# Patient Record
Sex: Female | Born: 1955 | Race: White | Hispanic: No | Smoking: Current every day smoker
Health system: Southern US, Community
[De-identification: ages and names within clinical notes are randomized; demographics above are authoritative.]

## PROBLEM LIST (undated history)

## (undated) DIAGNOSIS — E119 Type 2 diabetes mellitus without complications: Secondary | ICD-10-CM

## (undated) DIAGNOSIS — I10 Essential (primary) hypertension: Secondary | ICD-10-CM

## (undated) DIAGNOSIS — E785 Hyperlipidemia, unspecified: Secondary | ICD-10-CM

## (undated) HISTORY — DX: Hyperlipidemia, unspecified: E78.5

## (undated) HISTORY — PX: ANKLE FRACTURE SURGERY: SHX122

## (undated) HISTORY — DX: Essential (primary) hypertension: I10

## (undated) HISTORY — DX: Type 2 diabetes mellitus without complications: E11.9

---

## 2010-10-24 HISTORY — PX: COLONOSCOPY: SHX5424

## 2014-11-03 ENCOUNTER — Encounter: Payer: Self-pay | Admitting: Family Medicine

## 2015-01-26 ENCOUNTER — Ambulatory Visit (INDEPENDENT_AMBULATORY_CARE_PROVIDER_SITE_OTHER): Payer: BC Managed Care – PPO | Admitting: Family Medicine

## 2015-01-26 ENCOUNTER — Encounter: Payer: Self-pay | Admitting: Family Medicine

## 2015-01-26 VITALS — BP 120/60 | HR 76 | Ht 66.0 in | Wt 200.0 lb

## 2015-01-26 DIAGNOSIS — E785 Hyperlipidemia, unspecified: Secondary | ICD-10-CM | POA: Diagnosis not present

## 2015-01-26 DIAGNOSIS — E782 Mixed hyperlipidemia: Secondary | ICD-10-CM | POA: Insufficient documentation

## 2015-01-26 DIAGNOSIS — I10 Essential (primary) hypertension: Secondary | ICD-10-CM | POA: Diagnosis not present

## 2015-01-26 DIAGNOSIS — E119 Type 2 diabetes mellitus without complications: Secondary | ICD-10-CM

## 2015-01-26 DIAGNOSIS — Z23 Encounter for immunization: Secondary | ICD-10-CM

## 2015-01-26 MED ORDER — ASPIRIN 81 MG PO TABS: 81.0000 mg | ORAL_TABLET | Freq: Every day | ORAL | Status: DC

## 2015-01-26 MED ORDER — METFORMIN HCL 500 MG PO TABS: 500.0000 mg | ORAL_TABLET | Freq: Two times a day (BID) | ORAL | Status: DC

## 2015-01-26 MED ORDER — METOPROLOL TARTRATE 50 MG PO TABS: 50.0000 mg | ORAL_TABLET | Freq: Two times a day (BID) | ORAL | Status: DC

## 2015-01-26 MED ORDER — GEMFIBROZIL 600 MG PO TABS: 600.0000 mg | ORAL_TABLET | Freq: Two times a day (BID) | ORAL | Status: DC

## 2015-01-26 NOTE — Patient Instructions (Signed)
Smoking Cessation Quitting smoking is important to your health and has many advantages. However, it is not always easy to quit since nicotine is a very addictive drug. Oftentimes, people try 3 times or more before being able to quit. This document explains the best ways for you to prepare to quit smoking. Quitting takes hard work and a lot of effort, but you can do it. ADVANTAGES OF QUITTING SMOKING  You will live longer, feel better, and live better.  Your body will feel the impact of quitting smoking almost immediately.  Within 20 minutes, blood pressure decreases. Your pulse returns to its normal level.  After 8 hours, carbon monoxide levels in the blood return to normal. Your oxygen level increases.  After 24 hours, the chance of having a heart attack starts to decrease. Your breath, hair, and body stop smelling like smoke.  After 48 hours, damaged nerve endings begin to recover. Your sense of taste and smell improve.  After 72 hours, the body is virtually free of nicotine. Your bronchial tubes relax and breathing becomes easier.  After 2 to 12 weeks, lungs can hold more air. Exercise becomes easier and circulation improves.  The risk of having a heart attack, stroke, cancer, or lung disease is greatly reduced.  After 1 year, the risk of coronary heart disease is cut in half.  After 5 years, the risk of stroke falls to the same as a nonsmoker.  After 10 years, the risk of lung cancer is cut in half and the risk of other cancers decreases significantly.  After 15 years, the risk of coronary heart disease drops, usually to the level of a nonsmoker.  If you are pregnant, quitting smoking will improve your chances of having a healthy baby.  The people you live with, especially any children, will be healthier.  You will have extra money to spend on things other than cigarettes. QUESTIONS TO THINK ABOUT BEFORE ATTEMPTING TO QUIT You may want to talk about your answers with your  health care provider.  Why do you want to quit?  If you tried to quit in the past, what helped and what did not?  What will be the most difficult situations for you after you quit? How will you plan to handle them?  Who can help you through the tough times? Your family? Friends? A health care provider?  What pleasures do you get from smoking? What ways can you still get pleasure if you quit? Here are some questions to ask your health care provider:  How can you help me to be successful at quitting?  What medicine do you think would be best for me and how should I take it?  What should I do if I need more help?  What is smoking withdrawal like? How can I get information on withdrawal? GET READY  Set a quit date.  Change your environment by getting rid of all cigarettes, ashtrays, matches, and lighters in your home, car, or work. Do not let people smoke in your home.  Review your past attempts to quit. Think about what worked and what did not. GET SUPPORT AND ENCOURAGEMENT You have a better chance of being successful if you have help. You can get support in many ways.  Tell your family, friends, and coworkers that you are going to quit and need their support. Ask them not to smoke around you.  Get individual, group, or telephone counseling and support. Programs are available at local hospitals and health centers. Call   your local health department for information about programs in your area.  Spiritual beliefs and practices may help some smokers quit.  Download a "quit meter" on your computer to keep track of quit statistics, such as how long you have gone without smoking, cigarettes not smoked, and money saved.  Get a self-help book about quitting smoking and staying off tobacco. LEARN NEW SKILLS AND BEHAVIORS  Distract yourself from urges to smoke. Talk to someone, go for a walk, or occupy your time with a task.  Change your normal routine. Take a different route to work.  Drink tea instead of coffee. Eat breakfast in a different place.  Reduce your stress. Take a hot bath, exercise, or read a book.  Plan something enjoyable to do every day. Reward yourself for not smoking.  Explore interactive web-based programs that specialize in helping you quit. GET MEDICINE AND USE IT CORRECTLY Medicines can help you stop smoking and decrease the urge to smoke. Combining medicine with the above behavioral methods and support can greatly increase your chances of successfully quitting smoking.  Nicotine replacement therapy helps deliver nicotine to your body without the negative effects and risks of smoking. Nicotine replacement therapy includes nicotine gum, lozenges, inhalers, nasal sprays, and skin patches. Some may be available over-the-counter and others require a prescription.  Antidepressant medicine helps people abstain from smoking, but how this works is unknown. This medicine is available by prescription.  Nicotinic receptor partial agonist medicine simulates the effect of nicotine in your brain. This medicine is available by prescription. Ask your health care provider for advice about which medicines to use and how to use them based on your health history. Your health care provider will tell you what side effects to look out for if you choose to be on a medicine or therapy. Carefully read the information on the package. Do not use any other product containing nicotine while using a nicotine replacement product.  RELAPSE OR DIFFICULT SITUATIONS Most relapses occur within the first 3 months after quitting. Do not be discouraged if you start smoking again. Remember, most people try several times before finally quitting. You may have symptoms of withdrawal because your body is used to nicotine. You may crave cigarettes, be irritable, feel very hungry, cough often, get headaches, or have difficulty concentrating. The withdrawal symptoms are only temporary. They are strongest  when you first quit, but they will go away within 10-14 days. To reduce the chances of relapse, try to:  Avoid drinking alcohol. Drinking lowers your chances of successfully quitting.  Reduce the amount of caffeine you consume. Once you quit smoking, the amount of caffeine in your body increases and can give you symptoms, such as a rapid heartbeat, sweating, and anxiety.  Avoid smokers because they can make you want to smoke.  Do not let weight gain distract you. Many smokers will gain weight when they quit, usually less than 10 pounds. Eat a healthy diet and stay active. You can always lose the weight gained after you quit.  Find ways to improve your mood other than smoking. FOR MORE INFORMATION  www.smokefree.gov  Document Released: 04/05/2001 Document Revised: 08/26/2013 Document Reviewed: 07/21/2011 [employer] Patient Information 2015 [employer]. This information is not intended to replace advice given to you by your health care provider. Make sure you discuss any questions you have with your health care provider. Calorie Counting for Weight Loss Calories are energy you get from the things you eat and drink. Your body uses this energy   to keep you going throughout the day. The number of calories you eat affects your weight. When you eat more calories than your body needs, your body stores the extra calories as fat. When you eat fewer calories than your body needs, your body burns fat to get the energy it needs. Calorie counting means keeping track of how many calories you eat and drink each day. If you make sure to eat fewer calories than your body needs, you should lose weight. In order for calorie counting to work, you will need to eat the number of calories that are right for you in a day to lose a healthy amount of weight per week. A healthy amount of weight to lose per week is usually 1-2 lb (0.5-0.9 kg). A dietitian can determine how many calories you need in a day and give you  suggestions on how to reach your calorie goal.  WHAT IS MY MY PLAN? My goal is to have __________ calories per day.  If I have this many calories per day, I should lose around __________ pounds per week. WHAT DO I NEED TO KNOW ABOUT CALORIE COUNTING? In order to meet your daily calorie goal, you will need to:  Find out how many calories are in each food you would like to eat. Try to do this before you eat.  Decide how much of the food you can eat.  Write down what you ate and how many calories it had. Doing this is called keeping a food log. WHERE DO I FIND CALORIE INFORMATION? The number of calories in a food can be found on a Nutrition Facts label. Note that all the information on a label is based on a specific serving of the food. If a food does not have a Nutrition Facts label, try to look up the calories online or ask your dietitian for help. HOW DO I DECIDE HOW MUCH TO EAT? To decide how much of the food you can eat, you will need to consider both the number of calories in one serving and the size of one serving. This information can be found on the Nutrition Facts label. If a food does not have a Nutrition Facts label, look up the information online or ask your dietitian for help. Remember that calories are listed per serving. If you choose to have more than one serving of a food, you will have to multiply the calories per serving by the amount of servings you plan to eat. For example, the label on a package of bread might say that a serving size is 1 slice and that there are 90 calories in a serving. If you eat 1 slice, you will have eaten 90 calories. If you eat 2 slices, you will have eaten 180 calories. HOW DO I KEEP A FOOD LOG? After each meal, record the following information in your food log:  What you ate.  How much of it you ate.  How many calories it had.  Then, add up your calories. Keep your food log near you, such as in a small notebook in your pocket. Another option is  to use a mobile app or website. Some programs will calculate calories for you and show you how many calories you have left each time you add an item to the log. WHAT ARE SOME CALORIE COUNTING TIPS?  Use your calories on foods and drinks that will fill you up and not leave you hungry. Some examples of this include foods like nuts and   nut butters, vegetables, lean proteins, and high-fiber foods (more than 5 g fiber per serving).  Eat nutritious foods and avoid empty calories. Empty calories are calories you get from foods or beverages that do not have many nutrients, such as candy and soda. It is better to have a nutritious high-calorie food (such as an avocado) than a food with few nutrients (such as a bag of chips).  Know how many calories are in the foods you eat most often. This way, you do not have to look up how many calories they have each time you eat them.  Look out for foods that may seem like low-calorie foods but are really high-calorie foods, such as baked goods, soda, and fat-free candy.  Pay attention to calories in drinks. Drinks such as sodas, specialty coffee drinks, alcohol, and juices have a lot of calories yet do not fill you up. Choose low-calorie drinks like water and diet drinks.  Focus your calorie counting efforts on higher calorie items. Logging the calories in a garden salad that contains only vegetables is less important than calculating the calories in a milk shake.  Find a way of tracking calories that works for you. Get creative. Most people who are successful find ways to keep track of how much they eat in a day, even if they do not count every calorie. WHAT ARE SOME PORTION CONTROL TIPS?  Know how many calories are in a serving. This will help you know how many servings of a certain food you can have.  Use a measuring cup to measure serving sizes. This is helpful when you start out. With time, you will be able to estimate serving sizes for some foods.  Take some  time to put servings of different foods on your favorite plates, bowls, and cups so you know what a serving looks like.  Try not to eat straight from a bag or box. Doing this can lead to overeating. Put the amount you would like to eat in a cup or on a plate to make sure you are eating the right portion.  Use smaller plates, glasses, and bowls to prevent overeating. This is a quick and easy way to practice portion control. If your plate is smaller, less food can fit on it.  Try not to multitask while eating, such as watching TV or using your computer. If it is time to eat, sit down at a table and enjoy your food. Doing this will help you to start recognizing when you are full. It will also make you more aware of what and how much you are eating. HOW CAN I CALORIE COUNT WHEN EATING OUT?  Ask for smaller portion sizes or child-sized portions.  Consider sharing an entree and sides instead of getting your own entree.  If you get your own entree, eat only half. Ask for a box at the beginning of your meal and put the rest of your entree in it so you are not tempted to eat it.  Look for the calories on the menu. If calories are listed, choose the lower calorie options.  Choose dishes that include vegetables, fruits, whole grains, low-fat dairy products, and lean protein. Focusing on smart food choices from each of the 5 food groups can help you stay on track at restaurants.  Choose items that are boiled, broiled, grilled, or steamed.  Choose water, milk, unsweetened iced tea, or other drinks without added sugars. If you want an alcoholic beverage, choose a lower calorie option.   For example, a regular margarita can have up to 700 calories and a glass of wine has around 150.  Stay away from items that are buttered, battered, fried, or served with cream sauce. Items labeled "crispy" are usually fried, unless stated otherwise.  Ask for dressings, sauces, and syrups on the side. These are usually very  high in calories, so do not eat much of them.  Watch out for salads. Many people think salads are a healthy option, but this is often not the case. Many salads come with bacon, fried chicken, lots of cheese, fried chips, and dressing. All of these items have a lot of calories. If you want a salad, choose a garden salad and ask for grilled meats or steak. Ask for the dressing on the side, or ask for olive oil and vinegar or lemon to use as dressing.  Estimate how many servings of a food you are given. For example, a serving of cooked rice is  cup or about the size of half a tennis ball or one cupcake wrapper. Knowing serving sizes will help you be aware of how much food you are eating at restaurants. The list below tells you how big or small some common portion sizes are based on everyday objects.  1 oz--4 stacked dice.  3 oz--1 deck of cards.  1 tsp--1 dice.  1 Tbsp-- a Ping-Pong ball.  2 Tbsp--1 Ping-Pong ball.   cup--1 tennis ball or 1 cupcake wrapper.  1 cup--1 baseball. Document Released: 04/11/2005 Document Revised: 08/26/2013 Document Reviewed: 02/14/2013 [employer] Patient Information 2015 [employer]. This information is not intended to replace advice given to you by your health care provider. Make sure you discuss any questions you have with your health care provider.  

## 2015-01-26 NOTE — Progress Notes (Signed)
Name: Brandy Cummings   MRN: [id number]    DOB: February 18, 1956   Date:01/26/2015       Progress Note  Subjective  Chief Complaint  Chief Complaint  Patient presents with  . Hypertension  . Hyperlipidemia  . Diabetes    Hypertension This is a chronic problem. The current episode started more than 1 year ago. The problem has been gradually improving since onset. The problem is controlled. Pertinent negatives include no anxiety, blurred vision, chest pain, headaches, malaise/fatigue, neck pain, orthopnea, palpitations, peripheral edema, PND, shortness of breath or sweats. Risk factors for coronary artery disease include dyslipidemia, obesity, smoking/tobacco exposure and post-menopausal state. Past treatments include beta blockers. The current treatment provides moderate improvement. There are no compliance problems.  There is no history of angina, kidney disease, CAD/MI, CVA, heart failure, left ventricular hypertrophy, PVD, renovascular disease or retinopathy. There is no history of chronic renal disease.  Hyperlipidemia This is a chronic problem. The current episode started more than 1 year ago. The problem is controlled. Recent lipid tests were reviewed and are low. Exacerbating diseases include obesity. She has no history of chronic renal disease, diabetes, hypothyroidism, liver disease or nephrotic syndrome. There are no known factors aggravating her hyperlipidemia. Pertinent negatives include no chest pain, focal weakness, myalgias or shortness of breath. Current antihyperlipidemic treatment includes fibric acid derivatives. The current treatment provides moderate improvement of lipids. There are no compliance problems.  Risk factors for coronary artery disease include diabetes mellitus, dyslipidemia, hypertension, obesity and post-menopausal.  Diabetes She presents for her follow-up diabetic visit. Her disease course has been improving. Pertinent negatives for hypoglycemia include no confusion,  dizziness, headaches, hunger, mood changes, nervousness/anxiousness, pallor, seizures, sleepiness, speech difficulty, sweats or tremors. Associated symptoms include polydipsia and polyuria. Pertinent negatives for diabetes include no blurred vision, no chest pain, no fatigue, no foot paresthesias, no foot ulcerations, no polyphagia, no visual change, no weakness and no weight loss. There are no hypoglycemic complications. Pertinent negatives for hypoglycemia complications include no blackouts. Symptoms are stable. Pertinent negatives for diabetic complications include no CVA, PVD or retinopathy. She is following a generally healthy diet. Home blood sugar record trend: not checking. Eye exam is current.    No problem-specific assessment & plan notes found for this encounter.   Past Medical History  Diagnosis Date  . Hyperlipidemia   . Hypertension   . Diabetes mellitus without complication [facility])     Past Surgical History  Procedure Laterality Date  . Ankle fracture surgery Left   . Colonoscopy  10/24/2010    cleared for 5 years- [facility]     Family History  Problem Relation Age of Onset  . Diabetes Mother   . Cancer Father   . Heart disease Father   . Cancer Maternal Grandfather   . Cancer Paternal Grandmother     Social History   Social History  . Marital Status: Divorced    Spouse Name: N/A  . Number of Children: N/A  . Years of Education: N/A   Occupational History  . Not on file.   Social History Main Topics  . Smoking status: Current Every Day Smoker  . Smokeless tobacco: Not on file  . Alcohol Use: No  . Drug Use: No  . Sexual Activity: No   Other Topics Concern  . Not on file   Social History Narrative  . No narrative on file    No Known Allergies   Review of Systems  Constitutional: Negative for fever, chills, weight  loss, malaise/fatigue and fatigue.  HENT: Negative for ear discharge, ear pain and sore throat.   Eyes: Negative for blurred vision.   Respiratory: Negative for cough, sputum production, shortness of breath and wheezing.   Cardiovascular: Negative for chest pain, palpitations, orthopnea, leg swelling and PND.  Gastrointestinal: Negative for heartburn, nausea, abdominal pain, diarrhea, constipation, blood in stool and melena.  Genitourinary: Negative for dysuria, urgency, frequency and hematuria.  Musculoskeletal: Negative for myalgias, back pain, joint pain and neck pain.       "knot" tendon left foot  Skin: Negative for pallor and rash.  Neurological: Negative for dizziness, tingling, tremors, sensory change, focal weakness, seizures, speech difficulty, weakness and headaches.  Endo/Heme/Allergies: Positive for polydipsia. Negative for environmental allergies and polyphagia. Does not bruise/bleed easily.  Psychiatric/Behavioral: Negative for depression, suicidal ideas and confusion. The patient is not nervous/anxious and does not have insomnia.      Objective  Filed Vitals:   01/26/15 0810  BP: 120/60  Pulse: 76  Height:  (1.676 m)  Weight: 200 lb (90.719 kg)    Physical Exam  Constitutional: She is well-developed, well-nourished, and in no distress. No distress.  HENT:  Head: Normocephalic and atraumatic.  Right Ear: External ear normal.  Left Ear: External ear normal.  Nose: Nose normal.  Mouth/Throat: Oropharynx is clear and moist.  Eyes: Conjunctivae and EOM are normal. Pupils are equal, round, and reactive to light. Right eye exhibits no discharge. Left eye exhibits no discharge.  Neck: Normal range of motion. Neck supple. No JVD present. No thyromegaly present.  Cardiovascular: Normal rate, regular rhythm, normal heart sounds and intact distal pulses.  Exam reveals no gallop and no friction rub.   No murmur heard. Pulmonary/Chest: Effort normal and breath sounds normal.  Abdominal: Soft. Bowel sounds are normal. She exhibits no mass. There is no tenderness. There is no guarding.  Musculoskeletal:  Normal range of motion. She exhibits no edema.  Lymphadenopathy:    She has no cervical adenopathy.  Neurological: She is alert. She has normal reflexes.  Skin: Skin is warm and dry. She is not diaphoretic.  Psychiatric: Mood and affect normal.  Nursing note and vitals reviewed.     Assessment & Plan  Problem List Items Addressed This Visit      Cardiovascular and Mediastinum   Essential hypertension - Primary   Relevant Medications   aspirin 81 MG tablet   gemfibrozil (LOPID) 600 MG tablet   metoprolol (LOPRESSOR) 50 MG tablet   Other Relevant Orders   Renal Function Panel     Endocrine   Type 2 diabetes mellitus without complication, without long-term current use of insulin (HCC)   Relevant Medications   aspirin 81 MG tablet   metFORMIN (GLUCOPHAGE) 500 MG tablet   Other Relevant Orders   HgB A1c     Other   Hyperlipidemia   Relevant Medications   aspirin 81 MG tablet   gemfibrozil (LOPID) 600 MG tablet   metoprolol (LOPRESSOR) 50 MG tablet   Other Relevant Orders   Lipid Profile    Other Visit Diagnoses    Need for influenza vaccination        Relevant Orders    Flu Vaccine QUAD 36+ mos PF IM (Fluarix & Fluzone Quad PF) (Completed)    Need for pneumococcal vaccination        Relevant Orders    Pneumococcal polysaccharide vaccine 23-valent greater than or equal to 2yo subcutaneous/IM (Completed)  Dr. Hayden [facility]  01/26/2015

## 2015-01-27 LAB — RENAL FUNCTION PANEL
ALBUMIN: 4.6 g/dL (ref 3.5–5.5)
BUN/Creatinine Ratio: 19 (ref 9–23)
BUN: 14 mg/dL (ref 6–24)
CALCIUM: 9.7 mg/dL (ref 8.7–10.2)
CHLORIDE: 99 mmol/L (ref 97–108)
CO2: 23 mmol/L (ref 18–29)
Creatinine, Ser: 0.73 mg/dL (ref 0.57–1.00)
GFR calc Af Amer: 105 mL/min/{1.73_m2} (ref >59)
GFR calc non Af Amer: 91 mL/min/{1.73_m2} (ref >59)
Glucose: 103 mg/dL — ABNORMAL HIGH (ref 65–99)
Phosphorus: 3.9 mg/dL (ref 2.5–4.5)
Potassium: 5.1 mmol/L (ref 3.5–5.2)
Sodium: 139 mmol/L (ref 134–144)

## 2015-01-27 LAB — HEMOGLOBIN A1C: HEMOGLOBIN A1C: 6.3 % — AB (ref 4.8–5.6)

## 2015-01-27 LAB — LIPID PANEL
Chol/HDL Ratio: 5.5 ratio units — ABNORMAL HIGH (ref 0.0–4.4)
Cholesterol, Total: 236 mg/dL — ABNORMAL HIGH (ref 100–199)
HDL: 43 mg/dL (ref >39)
LDL Calculated: 159 mg/dL — ABNORMAL HIGH (ref 0–99)
Triglycerides: 171 mg/dL — ABNORMAL HIGH (ref 0–149)
VLDL CHOLESTEROL CAL: 34 mg/dL (ref 5–40)

## 2015-07-27 ENCOUNTER — Ambulatory Visit (INDEPENDENT_AMBULATORY_CARE_PROVIDER_SITE_OTHER): Payer: BC Managed Care – PPO | Admitting: Family Medicine

## 2015-07-27 ENCOUNTER — Encounter: Payer: Self-pay | Admitting: Family Medicine

## 2015-07-27 VITALS — BP 130/70 | HR 68 | Ht 66.0 in | Wt 201.0 lb

## 2015-07-27 DIAGNOSIS — E785 Hyperlipidemia, unspecified: Secondary | ICD-10-CM | POA: Diagnosis not present

## 2015-07-27 DIAGNOSIS — E119 Type 2 diabetes mellitus without complications: Secondary | ICD-10-CM | POA: Diagnosis not present

## 2015-07-27 DIAGNOSIS — I10 Essential (primary) hypertension: Secondary | ICD-10-CM

## 2015-07-27 MED ORDER — METFORMIN HCL 500 MG PO TABS: 500.0000 mg | ORAL_TABLET | Freq: Two times a day (BID) | ORAL | Status: DC

## 2015-07-27 MED ORDER — GEMFIBROZIL 600 MG PO TABS: 600.0000 mg | ORAL_TABLET | Freq: Two times a day (BID) | ORAL | Status: DC

## 2015-07-27 MED ORDER — ASPIRIN 81 MG PO TABS: 81.0000 mg | ORAL_TABLET | Freq: Every day | ORAL | Status: DC

## 2015-07-27 MED ORDER — METOPROLOL TARTRATE 50 MG PO TABS: 50.0000 mg | ORAL_TABLET | Freq: Two times a day (BID) | ORAL | Status: DC

## 2015-07-27 NOTE — Progress Notes (Signed)
Name: Brandy Cummings   MRN: [id number]    DOB: 08-Aug-1955   Date:07/27/2015       Progress Note  Subjective  Chief Complaint  Chief Complaint  Patient presents with  . Hypertension  . Hyperlipidemia  . Diabetes    Hypertension This is a chronic problem. The current episode started more than 1 year ago. The problem has been gradually improving since onset. The problem is controlled. Associated symptoms include anxiety. Pertinent negatives include no blurred vision, chest pain, headaches, malaise/fatigue, neck pain, orthopnea, palpitations, peripheral edema, PND, shortness of breath or sweats. There are no associated agents to hypertension. There are no known risk factors for coronary artery disease. Past treatments include beta blockers. The current treatment provides moderate improvement. There are no compliance problems.  There is no history of angina, kidney disease, CAD/MI, CVA, heart failure, left ventricular hypertrophy, PVD, renovascular disease or retinopathy. There is no history of chronic renal disease or a hypertension causing med.  Hyperlipidemia This is a chronic problem. The current episode started more than 1 year ago. The problem is controlled. Recent lipid tests were reviewed and are normal. Exacerbating diseases include diabetes. She has no history of chronic renal disease, hypothyroidism, liver disease, obesity or nephrotic syndrome. There are no known factors aggravating her hyperlipidemia. Pertinent negatives include no chest pain, focal sensory loss, focal weakness, leg pain, myalgias or shortness of breath. Current antihyperlipidemic treatment includes statins. The current treatment provides no improvement of lipids. There are no compliance problems.  Risk factors for coronary artery disease include hypertension, post-menopausal, dyslipidemia and diabetes mellitus.  Diabetes She presents for her follow-up diabetic visit. She has type 2 diabetes mellitus. Her disease course has  been stable. Pertinent negatives for hypoglycemia include no confusion, dizziness, headaches, hunger, mood changes, nervousness/anxiousness, pallor, seizures, sleepiness, speech difficulty, sweats or tremors. Pertinent negatives for diabetes include no blurred vision, no chest pain, no fatigue, no foot paresthesias, no foot ulcerations, no polydipsia, no polyphagia, no polyuria, no visual change, no weakness and no weight loss. There are no hypoglycemic complications. Pertinent negatives for diabetic complications include no CVA, PVD or retinopathy. Risk factors for coronary artery disease include tobacco exposure. Current diabetic treatment includes oral agent (monotherapy). She is compliant with treatment all of the time.    No problem-specific assessment & plan notes found for this encounter.   Past Medical History  Diagnosis Date  . Hyperlipidemia   . Hypertension   . Diabetes mellitus without complication [facility])     Past Surgical History  Procedure Laterality Date  . Ankle fracture surgery Left   . Colonoscopy  10/24/2010    cleared for 5 years- [facility]     Family History  Problem Relation Age of Onset  . Diabetes Mother   . Cancer Father   . Heart disease Father   . Cancer Maternal Grandfather   . Cancer Paternal Grandmother     Social History   Social History  . Marital Status: Divorced    Spouse Name: N/A  . Number of Children: N/A  . Years of Education: N/A   Occupational History  . Not on file.   Social History Main Topics  . Smoking status: Current Every Day Smoker  . Smokeless tobacco: Not on file  . Alcohol Use: No  . Drug Use: No  . Sexual Activity: No   Other Topics Concern  . Not on file   Social History Narrative    No Known Allergies   Review of Systems  Constitutional: Negative for fever, chills, weight loss, malaise/fatigue and fatigue.  HENT: Negative for ear discharge, ear pain and sore throat.   Eyes: Negative for blurred vision.   Respiratory: Negative for cough, sputum production, shortness of breath and wheezing.   Cardiovascular: Negative for chest pain, palpitations, orthopnea, leg swelling and PND.  Gastrointestinal: Negative for heartburn, nausea, abdominal pain, diarrhea, constipation, blood in stool and melena.  Genitourinary: Negative for dysuria, urgency, frequency and hematuria.  Musculoskeletal: Negative for myalgias, back pain, joint pain and neck pain.  Skin: Negative for pallor and rash.  Neurological: Negative for dizziness, tingling, tremors, sensory change, focal weakness, seizures, speech difficulty, weakness and headaches.  Endo/Heme/Allergies: Negative for environmental allergies, polydipsia and polyphagia. Does not bruise/bleed easily.  Psychiatric/Behavioral: Negative for depression, suicidal ideas and confusion. The patient is not nervous/anxious and does not have insomnia.      Objective  Filed Vitals:   07/27/15 0804  BP: 130/70  Pulse: 68  Height: 5\' 6"  (1.676 m)  Weight: 201 lb (91.173 kg)    Physical Exam  Constitutional: She is oriented to person, place, and time and well-developed, well-nourished, and in no distress. No distress.  HENT:  Head: Normocephalic and atraumatic.  Right Ear: External ear normal.  Left Ear: External ear normal.  Nose: Nose normal.  Mouth/Throat: Oropharynx is clear and moist.  Eyes: Conjunctivae and EOM are normal. Pupils are equal, round, and reactive to light. Right eye exhibits no discharge. Left eye exhibits no discharge.  Neck: Normal range of motion. Neck supple. No JVD present. No thyromegaly present.  Cardiovascular: Normal rate, regular rhythm, normal heart sounds and intact distal pulses.  Exam reveals no gallop and no friction rub.   No murmur heard. Pulmonary/Chest: Effort normal and breath sounds normal. No respiratory distress. She has no wheezes. She has no rales.  Abdominal: Soft. Bowel sounds are normal. She exhibits no mass. There  is no tenderness. There is no guarding.  Musculoskeletal: Normal range of motion. She exhibits no edema.  Lymphadenopathy:    She has no cervical adenopathy.  Neurological: She is alert and oriented to person, place, and time. She has normal motor skills, normal sensation, normal strength and normal reflexes.  Monofilament normal  Skin: Skin is warm and dry. She is not diaphoretic.  Psychiatric: Mood and affect normal.  Nursing note and vitals reviewed.     Assessment & Plan  Problem List Items Addressed This Visit      Cardiovascular and Mediastinum   Essential hypertension   Relevant Medications   aspirin 81 MG tablet   gemfibrozil (LOPID) 600 MG tablet   metoprolol (LOPRESSOR) 50 MG tablet   Other Relevant Orders   Renal Function Panel     Endocrine   Type 2 diabetes mellitus without complication, without long-term current use of insulin (HCC) - Primary   Relevant Medications   aspirin 81 MG tablet   metFORMIN (GLUCOPHAGE) 500 MG tablet   Other Relevant Orders   Hemoglobin A1c   Microalbumin / creatinine urine ratio     Other   Hyperlipidemia   Relevant Medications   aspirin 81 MG tablet   gemfibrozil (LOPID) 600 MG tablet   metoprolol (LOPRESSOR) 50 MG tablet   Other Relevant Orders   Lipid Profile        Dr. Hayden Rasmusseneanna Jones [facility]  07/27/2015

## 2015-07-28 LAB — HEMOGLOBIN A1C
Est. average glucose Bld gHb Est-mCnc: 126 mg/dL
Hgb A1c MFr Bld: 6 % — ABNORMAL HIGH (ref 4.8–5.6)

## 2015-07-28 LAB — RENAL FUNCTION PANEL
Albumin: 4.5 g/dL (ref 3.5–5.5)
BUN / CREAT RATIO: 17 (ref 9–23)
BUN: 13 mg/dL (ref 6–24)
CO2: 20 mmol/L (ref 18–29)
CREATININE: 0.76 mg/dL (ref 0.57–1.00)
Calcium: 9.5 mg/dL (ref 8.7–10.2)
Chloride: 96 mmol/L (ref 96–106)
GFR calc Af Amer: 100 mL/min/{1.73_m2} (ref >59)
GFR, EST NON AFRICAN AMERICAN: 87 mL/min/{1.73_m2} (ref >59)
Glucose: 104 mg/dL — ABNORMAL HIGH (ref 65–99)
PHOSPHORUS: 4 mg/dL (ref 2.5–4.5)
Potassium: 4.8 mmol/L (ref 3.5–5.2)
SODIUM: 137 mmol/L (ref 134–144)

## 2015-07-28 LAB — MICROALBUMIN / CREATININE URINE RATIO
Creatinine, Urine: 193.8 mg/dL
MICROALB/CREAT RATIO: 19.3 mg/g creat (ref 0.0–30.0)
MICROALBUM., U, RANDOM: 37.5 ug/mL

## 2015-07-28 LAB — LIPID PANEL
CHOL/HDL RATIO: 5.3 ratio — AB (ref 0.0–4.4)
Cholesterol, Total: 232 mg/dL — ABNORMAL HIGH (ref 100–199)
HDL: 44 mg/dL (ref >39)
LDL CALC: 154 mg/dL — AB (ref 0–99)
TRIGLYCERIDES: 171 mg/dL — AB (ref 0–149)
VLDL Cholesterol Cal: 34 mg/dL (ref 5–40)

## 2016-02-02 ENCOUNTER — Encounter: Payer: Self-pay | Admitting: Family Medicine

## 2016-02-02 ENCOUNTER — Ambulatory Visit (INDEPENDENT_AMBULATORY_CARE_PROVIDER_SITE_OTHER): Payer: BC Managed Care – PPO | Admitting: Family Medicine

## 2016-02-02 VITALS — BP 130/80 | HR 80 | Ht 66.0 in | Wt 207.0 lb

## 2016-02-02 DIAGNOSIS — I10 Essential (primary) hypertension: Secondary | ICD-10-CM

## 2016-02-02 DIAGNOSIS — E119 Type 2 diabetes mellitus without complications: Secondary | ICD-10-CM | POA: Diagnosis not present

## 2016-02-02 DIAGNOSIS — E781 Pure hyperglyceridemia: Secondary | ICD-10-CM

## 2016-02-02 DIAGNOSIS — F331 Major depressive disorder, recurrent, moderate: Secondary | ICD-10-CM

## 2016-02-02 MED ORDER — GEMFIBROZIL 600 MG PO TABS: 600.0000 mg | ORAL_TABLET | Freq: Two times a day (BID) | ORAL | 1 refills | Status: DC

## 2016-02-02 MED ORDER — METOPROLOL TARTRATE 50 MG PO TABS: 50.0000 mg | ORAL_TABLET | Freq: Two times a day (BID) | ORAL | 1 refills | Status: DC

## 2016-02-02 MED ORDER — METFORMIN HCL 500 MG PO TABS: 500.0000 mg | ORAL_TABLET | Freq: Two times a day (BID) | ORAL | 1 refills | Status: DC

## 2016-02-02 MED ORDER — BUPROPION HCL ER (XL) 150 MG PO TB24: 150.0000 mg | ORAL_TABLET | Freq: Every day | ORAL | 5 refills | Status: DC

## 2016-02-02 NOTE — Progress Notes (Signed)
Name: Brandy Cummings   MRN: [id number]    DOB: 02/28/1956   Date:02/02/2016       Progress Note  Subjective  Chief Complaint  Chief Complaint  Patient presents with  . Hypertension  . Diabetes  . Hyperlipidemia    Hypertension  This is a chronic problem. The current episode started more than 1 year ago. The problem has been gradually improving since onset. The problem is controlled. Associated symptoms include anxiety. Pertinent negatives include no blurred vision, chest pain, headaches, malaise/fatigue, neck pain, orthopnea, palpitations, peripheral edema, PND, shortness of breath or sweats. There are no associated agents to hypertension. Risk factors for coronary artery disease include diabetes mellitus, smoking/tobacco exposure and dyslipidemia. Past treatments include beta blockers. The current treatment provides mild improvement. There are no compliance problems.  There is no history of angina, kidney disease, CAD/MI, CVA, heart failure, left ventricular hypertrophy, PVD, renovascular disease or retinopathy. There is no history of chronic renal disease or a hypertension causing med.  Diabetes  She presents for her follow-up diabetic visit. She has type 2 diabetes mellitus. Her disease course has been stable. Pertinent negatives for hypoglycemia include no confusion, dizziness, headaches, hunger, mood changes, nervousness/anxiousness, pallor, seizures, sleepiness, speech difficulty, sweats or tremors. Pertinent negatives for diabetes include no blurred vision, no chest pain, no fatigue, no foot paresthesias, no foot ulcerations, no polydipsia, no polyphagia, no polyuria, no visual change, no weakness and no weight loss. There are no hypoglycemic complications. Symptoms are stable. There are no diabetic complications. Pertinent negatives for diabetic complications include no CVA, PVD or retinopathy. Risk factors for coronary artery disease include hypertension, dyslipidemia, obesity and tobacco  exposure. Current diabetic treatment includes oral agent (monotherapy). She is compliant with treatment all of the time. She is following a generally healthy diet. She participates in exercise daily. An ACE inhibitor/angiotensin II receptor blocker is not being taken. She does not see a podiatrist.Eye exam is not current.  Hyperlipidemia  This is a chronic problem. The current episode started more than 1 year ago. The problem is controlled. Recent lipid tests were reviewed and are normal. She has no history of chronic renal disease, diabetes, hypothyroidism, liver disease, obesity or nephrotic syndrome. Factors aggravating her hyperlipidemia include smoking. Pertinent negatives include no chest pain, focal sensory loss, focal weakness, leg pain, myalgias or shortness of breath. Current antihyperlipidemic treatment includes fibric acid derivatives. The current treatment provides mild improvement of lipids. There are no compliance problems.  There are no known risk factors for coronary artery disease.  Depression       The patient presents with depression.  This is a new problem.  The current episode started more than 1 month ago.   The onset quality is gradual.   The problem occurs intermittently.  Associated symptoms include decreased interest and sad.  Associated symptoms include no decreased concentration, no fatigue, no helplessness, no hopelessness, does not have insomnia, not irritable, no restlessness, no appetite change, no body aches, no myalgias, no headaches, no indigestion and no suicidal ideas.( thougts occurs )     The symptoms are aggravated by family issues.  Past treatments include nothing.  Compliance with treatment is good.  Previous treatment provided no relief relief.  Past medical history includes terminal illness, anxiety and depression.     Pertinent negatives include no fibromyalgia, no hypothyroidism and no suicide attempts.   No problem-specific Assessment & Plan notes found for this  encounter.   Past Medical History:  Diagnosis Date  .  Diabetes mellitus without complication (HCC)   . Hyperlipidemia   . Hypertension     Past Surgical History:  Procedure Laterality Date  . ANKLE FRACTURE SURGERY Left   . COLONOSCOPY  10/24/2010   cleared for 5 years- [facility]     Family History  Problem Relation Age of Onset  . Diabetes Mother   . Cancer Father   . Heart disease Father   . Cancer Maternal Grandfather   . Cancer Paternal Grandmother     Social History   Social History  . Marital status: Divorced    Spouse name: N/A  . Number of children: N/A  . Years of education: N/A   Occupational History  . Not on file.   Social History Main Topics  . Smoking status: Current Every Day Smoker  . Smokeless tobacco: Not on file  . Alcohol use No  . Drug use: No  . Sexual activity: No   Other Topics Concern  . Not on file   Social History Narrative  . No narrative on file    No Known Allergies   Review of Systems  Constitutional: Negative for appetite change, chills, fatigue, fever, malaise/fatigue and weight loss.  HENT: Negative for ear discharge, ear pain and sore throat.   Eyes: Negative for blurred vision, pain, discharge and redness.  Respiratory: Negative for cough, sputum production, shortness of breath and wheezing.   Cardiovascular: Negative for chest pain, palpitations, orthopnea, leg swelling and PND.  Gastrointestinal: Negative for abdominal pain, blood in stool, constipation, diarrhea, heartburn, melena and nausea.  Genitourinary: Negative for dysuria, frequency, hematuria and urgency.  Musculoskeletal: Negative for back pain, joint pain, myalgias and neck pain.  Skin: Negative for pallor and rash.  Neurological: Negative for dizziness, tingling, tremors, sensory change, focal weakness, seizures, speech difficulty, weakness and headaches.  Endo/Heme/Allergies: Negative for environmental allergies, polydipsia and polyphagia. Does not  bruise/bleed easily.  Psychiatric/Behavioral: Positive for depression. Negative for confusion, decreased concentration and suicidal ideas. The patient is not nervous/anxious and does not have insomnia.      Objective  Vitals:   02/02/16 1333  BP: 130/80  Pulse: 80  Weight: 207 lb (93.9 kg)  Height: 5\' 6"  (1.676 m)    Physical Exam  Constitutional: She is well-developed, well-nourished, and in no distress. She is not irritable. No distress.  HENT:  Head: Normocephalic and atraumatic.  Right Ear: External ear normal.  Left Ear: External ear normal.  Nose: Nose normal.  Mouth/Throat: Oropharynx is clear and moist.  Eyes: Conjunctivae and EOM are normal. Pupils are equal, round, and reactive to light. Right eye exhibits no discharge. Left eye exhibits no discharge.  Neck: Normal range of motion. Neck supple. No JVD present. No thyromegaly present.  Cardiovascular: Normal rate, regular rhythm, normal heart sounds and intact distal pulses.  Exam reveals no gallop and no friction rub.   No murmur heard. Pulmonary/Chest: Effort normal and breath sounds normal. She has no wheezes. She has no rales.  Abdominal: Soft. Bowel sounds are normal. She exhibits no mass. There is no tenderness. There is no guarding.  Musculoskeletal: Normal range of motion. She exhibits no edema.  Lymphadenopathy:    She has no cervical adenopathy.  Neurological: She is alert. She has normal reflexes.  Skin: Skin is warm and dry. She is not diaphoretic.  Psychiatric: Mood, affect and judgment normal.  Nursing note and vitals reviewed.     Assessment & Plan  Problem List Items Addressed This Visit  Cardiovascular and Mediastinum   Essential hypertension - Primary   Relevant Medications   metoprolol (LOPRESSOR) 50 MG tablet   gemfibrozil (LOPID) 600 MG tablet   Other Relevant Orders   Renal Function Panel     Endocrine   Type 2 diabetes mellitus without complication, without long-term current use  of insulin (HCC)   Relevant Medications   metFORMIN (GLUCOPHAGE) 500 MG tablet   Other Relevant Orders   Hemoglobin A1c     Other   Hyperlipidemia   Relevant Medications   metoprolol (LOPRESSOR) 50 MG tablet   gemfibrozil (LOPID) 600 MG tablet   Other Relevant Orders   Lipid Profile    Other Visit Diagnoses    Moderate episode of recurrent major depressive disorder (HCC)       suggest resume CBT   Relevant Medications   buPROPion (WELLBUTRIN XL) 150 MG 24 hr tablet        Dr. Hayden [facility]  02/02/16

## 2016-02-03 LAB — RENAL FUNCTION PANEL
Albumin: 4.8 g/dL (ref 3.5–5.5)
BUN/Creatinine Ratio: 19 (ref 9–23)
BUN: 15 mg/dL (ref 6–24)
CO2: 19 mmol/L (ref 18–29)
Calcium: 10.1 mg/dL (ref 8.7–10.2)
Chloride: 97 mmol/L (ref 96–106)
Creatinine, Ser: 0.81 mg/dL (ref 0.57–1.00)
GFR, EST AFRICAN AMERICAN: 92 mL/min/{1.73_m2} (ref >59)
GFR, EST NON AFRICAN AMERICAN: 80 mL/min/{1.73_m2} (ref >59)
GLUCOSE: 117 mg/dL — AB (ref 65–99)
POTASSIUM: 5 mmol/L (ref 3.5–5.2)
Phosphorus: 4.3 mg/dL (ref 2.5–4.5)
SODIUM: 138 mmol/L (ref 134–144)

## 2016-02-03 LAB — LIPID PANEL
Chol/HDL Ratio: 5.5 ratio units — ABNORMAL HIGH (ref 0.0–4.4)
Cholesterol, Total: 237 mg/dL — ABNORMAL HIGH (ref 100–199)
HDL: 43 mg/dL (ref >39)
LDL CALC: 163 mg/dL — AB (ref 0–99)
Triglycerides: 153 mg/dL — ABNORMAL HIGH (ref 0–149)
VLDL CHOLESTEROL CAL: 31 mg/dL (ref 5–40)

## 2016-02-03 LAB — HEMOGLOBIN A1C
Est. average glucose Bld gHb Est-mCnc: 126 mg/dL
Hgb A1c MFr Bld: 6 % — ABNORMAL HIGH (ref 4.8–5.6)

## 2016-08-02 ENCOUNTER — Encounter: Payer: Self-pay | Admitting: Family Medicine

## 2016-08-02 ENCOUNTER — Ambulatory Visit (INDEPENDENT_AMBULATORY_CARE_PROVIDER_SITE_OTHER): Payer: BC Managed Care – PPO | Admitting: Family Medicine

## 2016-08-02 VITALS — Resp 16 | Ht 66.0 in | Wt 219.0 lb

## 2016-08-02 DIAGNOSIS — I10 Essential (primary) hypertension: Secondary | ICD-10-CM | POA: Diagnosis not present

## 2016-08-02 DIAGNOSIS — F3341 Major depressive disorder, recurrent, in partial remission: Secondary | ICD-10-CM | POA: Diagnosis not present

## 2016-08-02 DIAGNOSIS — E781 Pure hyperglyceridemia: Secondary | ICD-10-CM | POA: Diagnosis not present

## 2016-08-02 DIAGNOSIS — F331 Major depressive disorder, recurrent, moderate: Secondary | ICD-10-CM | POA: Insufficient documentation

## 2016-08-02 DIAGNOSIS — E119 Type 2 diabetes mellitus without complications: Secondary | ICD-10-CM

## 2016-08-02 MED ORDER — METFORMIN HCL 500 MG PO TABS: 500.0000 mg | ORAL_TABLET | Freq: Two times a day (BID) | ORAL | 1 refills | Status: DC

## 2016-08-02 MED ORDER — BUPROPION HCL ER (XL) 300 MG PO TB24: 300.0000 mg | ORAL_TABLET | Freq: Every day | ORAL | 1 refills | Status: DC

## 2016-08-02 MED ORDER — METOPROLOL TARTRATE 50 MG PO TABS
50.0000 mg | ORAL_TABLET | Freq: Two times a day (BID) | ORAL | 1 refills | Status: DC
Start: 2016-08-02 — End: 2017-01-23

## 2016-08-02 MED ORDER — GEMFIBROZIL 600 MG PO TABS
600.0000 mg | ORAL_TABLET | Freq: Two times a day (BID) | ORAL | 1 refills | Status: DC
Start: 2016-08-02 — End: 2017-01-23

## 2016-08-02 NOTE — Progress Notes (Signed)
Name: Brandy Cummings   MRN: [id number]    DOB: 05/14/1955   Date:08/02/2016       Progress Note  Subjective  Chief Complaint  Chief Complaint  Patient presents with  . Hypertension  . Diabetes    Does not check BS- Last A1C 6.0 10/17    Hypertension  This is a chronic problem. The current episode started more than 1 year ago. The problem has been gradually improving since onset. The problem is controlled. Pertinent negatives include no anxiety, blurred vision, chest pain, headaches, malaise/fatigue, neck pain, orthopnea, palpitations, peripheral edema, PND, shortness of breath or sweats. There are no associated agents to hypertension. Risk factors for coronary artery disease include dyslipidemia and diabetes mellitus. Past treatments include beta blockers. The current treatment provides moderate improvement. There are no compliance problems.  There is no history of angina, kidney disease, CAD/MI, CVA, heart failure, left ventricular hypertrophy, PVD or retinopathy. There is no history of chronic renal disease, a hypertension causing med or renovascular disease.  Diabetes  She presents for her follow-up diabetic visit. She has type 2 diabetes mellitus. Her disease course has been stable. Pertinent negatives for hypoglycemia include no confusion, dizziness, headaches, hunger, mood changes, nervousness/anxiousness, pallor, seizures, sleepiness, speech difficulty, sweats or tremors. Pertinent negatives for diabetes include no blurred vision, no chest pain, no fatigue, no foot paresthesias, no foot ulcerations, no polydipsia, no polyphagia, no polyuria, no visual change, no weakness and no weight loss. There are no hypoglycemic complications. Symptoms are stable. Pertinent negatives for diabetic complications include no autonomic neuropathy, CVA, heart disease, impotence, peripheral neuropathy, PVD or retinopathy. There are no known risk factors for coronary artery disease. Current diabetic treatment  includes oral agent (monotherapy). She is following a generally healthy diet. When asked about meal planning, she reported none. She participates in exercise daily. Her breakfast blood glucose is taken between 7-8 am. Her breakfast blood glucose range is generally 110-130 mg/dl. An ACE inhibitor/angiotensin II receptor blocker is not being taken. She does not see a podiatrist.Eye exam is current.  Hyperlipidemia  This is a chronic problem. The problem is controlled. Recent lipid tests were reviewed and are normal. She has no history of chronic renal disease. Pertinent negatives include no chest pain, focal weakness, myalgias or shortness of breath. She is currently on no antihyperlipidemic treatment. The current treatment provides moderate improvement of lipids. There are no compliance problems.  Risk factors for coronary artery disease include diabetes mellitus, hypertension and dyslipidemia.  Depression       The patient presents with depression.  This is a chronic problem.  The current episode started more than 1 year ago.   The onset quality is gradual.   The problem has been waxing and waning since onset.  Associated symptoms include insomnia, irritable and sad.  Associated symptoms include no decreased concentration, no fatigue, no helplessness, no hopelessness, no restlessness, no decreased interest, no appetite change, no body aches, no myalgias, no headaches, no indigestion and no suicidal ideas.  Past treatments include SSRIs - Selective serotonin reuptake inhibitors.  Compliance with treatment is good.  Past medical history includes depression.     Pertinent negatives include no anxiety.   No problem-specific Assessment & Plan notes found for this encounter.   Past Medical History:  Diagnosis Date  . Diabetes mellitus without complication (HCC)   . Hyperlipidemia   . Hypertension     Past Surgical History:  Procedure Laterality Date  . ANKLE FRACTURE SURGERY Left   .  COLONOSCOPY   10/24/2010   cleared for 5 years- [facility]     Family History  Problem Relation Age of Onset  . Diabetes Mother   . Cancer Father   . Heart disease Father   . Cancer Maternal Grandfather   . Cancer Paternal Grandmother     Social History   Social History  . Marital status: Divorced    Spouse name: N/A  . Number of children: N/A  . Years of education: N/A   Occupational History  . Not on file.   Social History Main Topics  . Smoking status: Current Every Day Smoker    Packs/day: 1.00    Types: Cigarettes  . Smokeless tobacco: Never Used  . Alcohol use No  . Drug use: No  . Sexual activity: No   Other Topics Concern  . Not on file   Social History Narrative  . No narrative on file    No Known Allergies  Outpatient Medications Prior to Visit  Medication Sig Dispense Refill  . aspirin 81 MG tablet Take 1 tablet (81 mg total) by mouth daily. 30 tablet 11  . buPROPion (WELLBUTRIN XL) 150 MG 24 hr tablet Take 1 tablet (150 mg total) by mouth daily. 30 tablet 5  . gemfibrozil (LOPID) 600 MG tablet Take 1 tablet (600 mg total) by mouth 2 (two) times daily. 180 tablet 1  . metFORMIN (GLUCOPHAGE) 500 MG tablet Take 1 tablet (500 mg total) by mouth 2 (two) times daily. 180 tablet 1  . metoprolol (LOPRESSOR) 50 MG tablet Take 1 tablet (50 mg total) by mouth 2 (two) times daily. 180 tablet 1   No facility-administered medications prior to visit.     Review of Systems  Constitutional: Negative for appetite change, chills, fatigue, fever, malaise/fatigue and weight loss.  HENT: Negative for ear discharge, ear pain and sore throat.   Eyes: Negative for blurred vision.  Respiratory: Negative for cough, sputum production, shortness of breath and wheezing.   Cardiovascular: Negative for chest pain, palpitations, orthopnea, leg swelling and PND.  Gastrointestinal: Negative for abdominal pain, blood in stool, constipation, diarrhea, heartburn, melena and nausea.  Genitourinary:  Negative for dysuria, frequency, hematuria, impotence and urgency.  Musculoskeletal: Negative for back pain, joint pain, myalgias and neck pain.  Skin: Negative for pallor and rash.  Neurological: Negative for dizziness, tingling, tremors, sensory change, focal weakness, seizures, speech difficulty, weakness and headaches.  Endo/Heme/Allergies: Negative for environmental allergies, polydipsia and polyphagia. Does not bruise/bleed easily.  Psychiatric/Behavioral: Positive for depression. Negative for confusion, decreased concentration and suicidal ideas. The patient has insomnia. The patient is not nervous/anxious.      Objective  Vitals:   08/02/16 1554  Resp: 16  Weight: 219 lb (99.3 kg)  Height:  (1.676 m)    Physical Exam  Constitutional: She is well-developed, well-nourished, and in no distress. She is irritable. No distress.  HENT:  Head: Normocephalic and atraumatic.  Right Ear: External ear normal.  Left Ear: External ear normal.  Nose: Nose normal.  Mouth/Throat: Oropharynx is clear and moist.  Eyes: Conjunctivae and EOM are normal. Pupils are equal, round, and reactive to light. Right eye exhibits no discharge. Left eye exhibits no discharge.  Neck: Normal range of motion. Neck supple. No JVD present. No thyromegaly present.  Cardiovascular: Normal rate, regular rhythm, normal heart sounds and intact distal pulses.  Exam reveals no gallop and no friction rub.   No murmur heard. Pulmonary/Chest: Effort normal and breath sounds normal. She  has no wheezes. She has no rales.  Abdominal: Soft. Bowel sounds are normal. She exhibits no mass. There is no tenderness. There is no guarding.  Musculoskeletal: Normal range of motion. She exhibits no edema.  Lymphadenopathy:    She has no cervical adenopathy.  Neurological: She is alert. She has normal reflexes.  Skin: Skin is warm and dry. She is not diaphoretic.  Psychiatric: Mood and affect normal.  Nursing note and vitals  reviewed.     Assessment & Plan  Problem List Items Addressed This Visit      Cardiovascular and Mediastinum   Essential hypertension - Primary   Relevant Medications   metoprolol (LOPRESSOR) 50 MG tablet   gemfibrozil (LOPID) 600 MG tablet   Other Relevant Orders   Renal Function Panel     Endocrine   Type 2 diabetes mellitus without complication, without long-term current use of insulin (HCC)   Relevant Medications   metFORMIN (GLUCOPHAGE) 500 MG tablet   Other Relevant Orders   Hemoglobin A1c   Renal Function Panel     Other   Hyperlipidemia   Relevant Medications   metoprolol (LOPRESSOR) 50 MG tablet   gemfibrozil (LOPID) 600 MG tablet   Other Relevant Orders   Lipid Profile   Recurrent major depressive disorder, in partial remission (HCC)   Relevant Medications   buPROPion (WELLBUTRIN XL) 300 MG 24 hr tablet   Moderate episode of recurrent major depressive disorder (HCC)   Relevant Medications   buPROPion (WELLBUTRIN XL) 300 MG 24 hr tablet      Meds ordered this encounter  Medications  . metoprolol (LOPRESSOR) 50 MG tablet    Sig: Take 1 tablet (50 mg total) by mouth 2 (two) times daily.    Dispense:  180 tablet    Refill:  1  . gemfibrozil (LOPID) 600 MG tablet    Sig: Take 1 tablet (600 mg total) by mouth 2 (two) times daily.    Dispense:  180 tablet    Refill:  1  . metFORMIN (GLUCOPHAGE) 500 MG tablet    Sig: Take 1 tablet (500 mg total) by mouth 2 (two) times daily.    Dispense:  180 tablet    Refill:  1  . buPROPion (WELLBUTRIN XL) 300 MG 24 hr tablet    Sig: Take 1 tablet (300 mg total) by mouth daily.    Dispense:  90 tablet    Refill:  1      Dr. Elizabeth Sauer [facility]  08/02/16

## 2016-08-03 LAB — RENAL FUNCTION PANEL
ALBUMIN: 4.6 g/dL (ref 3.5–5.5)
BUN/Creatinine Ratio: 12 (ref 9–23)
BUN: 10 mg/dL (ref 6–24)
CALCIUM: 9.4 mg/dL (ref 8.7–10.2)
CHLORIDE: 105 mmol/L (ref 96–106)
CO2: 22 mmol/L (ref 18–29)
Creatinine, Ser: 0.82 mg/dL (ref 0.57–1.00)
GFR calc Af Amer: 91 mL/min/{1.73_m2} (ref >59)
GFR calc non Af Amer: 79 mL/min/{1.73_m2} (ref >59)
GLUCOSE: 104 mg/dL — AB (ref 65–99)
PHOSPHORUS: 3.8 mg/dL (ref 2.5–4.5)
Potassium: 4.9 mmol/L (ref 3.5–5.2)
SODIUM: 142 mmol/L (ref 134–144)

## 2016-08-03 LAB — HEMOGLOBIN A1C
ESTIMATED AVERAGE GLUCOSE: 126 mg/dL
Hgb A1c MFr Bld: 6 % — ABNORMAL HIGH (ref 4.8–5.6)

## 2016-08-03 LAB — LIPID PANEL
Chol/HDL Ratio: 5.5 ratio — ABNORMAL HIGH (ref 0.0–4.4)
Cholesterol, Total: 193 mg/dL (ref 100–199)
HDL: 35 mg/dL — ABNORMAL LOW (ref >39)
LDL Calculated: 103 mg/dL — ABNORMAL HIGH (ref 0–99)
Triglycerides: 273 mg/dL — ABNORMAL HIGH (ref 0–149)
VLDL Cholesterol Cal: 55 mg/dL — ABNORMAL HIGH (ref 5–40)

## 2016-09-27 ENCOUNTER — Encounter: Payer: Self-pay | Admitting: Family Medicine

## 2016-09-27 ENCOUNTER — Ambulatory Visit (INDEPENDENT_AMBULATORY_CARE_PROVIDER_SITE_OTHER): Payer: BC Managed Care – PPO | Admitting: Family Medicine

## 2016-09-27 VITALS — BP 130/70 | HR 64 | Ht 66.0 in | Wt 214.0 lb

## 2016-09-27 DIAGNOSIS — W57XXXA Bitten or stung by nonvenomous insect and other nonvenomous arthropods, initial encounter: Secondary | ICD-10-CM | POA: Diagnosis not present

## 2016-09-27 DIAGNOSIS — R21 Rash and other nonspecific skin eruption: Secondary | ICD-10-CM

## 2016-09-27 MED ORDER — DOXYCYCLINE HYCLATE 100 MG PO TABS: 100.0000 mg | ORAL_TABLET | Freq: Two times a day (BID) | ORAL | 0 refills | Status: DC

## 2016-09-27 MED ORDER — TRIAMCINOLONE ACETONIDE 0.1 % EX CREA: 1.0000 "application " | TOPICAL_CREAM | Freq: Two times a day (BID) | CUTANEOUS | 0 refills | Status: DC

## 2016-09-27 NOTE — Progress Notes (Signed)
Name: Brandy Cummings   MRN: [id number]    DOB: 01/25/1956   Date:09/27/2016       Progress Note  Subjective  Chief Complaint  Chief Complaint  Patient presents with  . Insect Bite    pulled tick off 11 days ago, prob had been there "a couple of days before finding it"- red and swollen    Patient sustained a tick bite ellevin days ago. Location back  Noted chills,diaphoresis and possibly low grade fever.    Rash  This is a new problem. The current episode started 1 to 4 weeks ago (11 days). The problem is unchanged. The affected locations include the back. The rash is characterized by redness, swelling and itchiness. She was exposed to an insect bite/sting. Pertinent negatives include no congestion, cough, diarrhea, fatigue, fever, joint pain, shortness of breath or sore throat. Past treatments include nothing. The treatment provided mild relief.    No problem-specific Assessment & Plan notes found for this encounter.   Past Medical History:  Diagnosis Date  . Diabetes mellitus without complication (HCC)   . Hyperlipidemia   . Hypertension     Past Surgical History:  Procedure Laterality Date  . ANKLE FRACTURE SURGERY Left   . COLONOSCOPY  10/24/2010   cleared for 5 years- [facility]     Family History  Problem Relation Age of Onset  . Diabetes Mother   . Cancer Father   . Heart disease Father   . Cancer Maternal Grandfather   . Cancer Paternal Grandmother     Social History   Social History  . Marital status: Divorced    Spouse name: N/A  . Number of children: N/A  . Years of education: N/A   Occupational History  . Not on file.   Social History Main Topics  . Smoking status: Current Every Day Smoker    Packs/day: 1.00    Types: Cigarettes  . Smokeless tobacco: Never Used  . Alcohol use No  . Drug use: No  . Sexual activity: No   Other Topics Concern  . Not on file   Social History Narrative  . No narrative on file    No Known Allergies  Outpatient  Medications Prior to Visit  Medication Sig Dispense Refill  . aspirin 81 MG tablet Take 1 tablet (81 mg total) by mouth daily. 30 tablet 11  . buPROPion (WELLBUTRIN XL) 300 MG 24 hr tablet Take 1 tablet (300 mg total) by mouth daily. 90 tablet 1  . gemfibrozil (LOPID) 600 MG tablet Take 1 tablet (600 mg total) by mouth 2 (two) times daily. 180 tablet 1  . metFORMIN (GLUCOPHAGE) 500 MG tablet Take 1 tablet (500 mg total) by mouth 2 (two) times daily. 180 tablet 1  . metoprolol (LOPRESSOR) 50 MG tablet Take 1 tablet (50 mg total) by mouth 2 (two) times daily. 180 tablet 1   No facility-administered medications prior to visit.     Review of Systems  Constitutional: Negative for chills, fatigue, fever, malaise/fatigue and weight loss.  HENT: Negative for congestion, ear discharge, ear pain and sore throat.   Eyes: Negative for blurred vision.  Respiratory: Negative for cough, sputum production, shortness of breath and wheezing.   Cardiovascular: Negative for chest pain, palpitations and leg swelling.  Gastrointestinal: Negative for abdominal pain, blood in stool, constipation, diarrhea, heartburn, melena and nausea.  Genitourinary: Negative for dysuria, frequency, hematuria and urgency.  Musculoskeletal: Negative for back pain, joint pain, myalgias and neck pain.  Skin: Negative  for rash.  Neurological: Negative for dizziness, tingling, sensory change, focal weakness and headaches.  Endo/Heme/Allergies: Negative for environmental allergies and polydipsia. Does not bruise/bleed easily.  Psychiatric/Behavioral: Negative for depression and suicidal ideas. The patient is not nervous/anxious and does not have insomnia.      Objective  Vitals:   09/27/16 1014  BP: 130/70  Pulse: 64  Weight: 214 lb (97.1 kg)  Height: 5\' 6"  (1.676 m)    Physical Exam  Constitutional: She is well-developed, well-nourished, and in no distress. No distress.  HENT:  Head: Normocephalic and atraumatic.  Right  Ear: External ear normal.  Left Ear: External ear normal.  Nose: Nose normal.  Mouth/Throat: Oropharynx is clear and moist.  Eyes: Conjunctivae and EOM are normal. Pupils are equal, round, and reactive to light. Right eye exhibits no discharge. Left eye exhibits no discharge.  Neck: Normal range of motion. Neck supple. No JVD present. No thyromegaly present.  Cardiovascular: Normal rate, regular rhythm, normal heart sounds and intact distal pulses.  Exam reveals no gallop and no friction rub.   No murmur heard. Pulmonary/Chest: Effort normal and breath sounds normal. She has no wheezes. She has no rales.  Abdominal: Soft. Bowel sounds are normal. She exhibits no mass. There is no tenderness. There is no guarding.  Musculoskeletal: Normal range of motion. She exhibits no edema.  Lymphadenopathy:    She has no cervical adenopathy.  Neurological: She is alert. She has normal reflexes.  Skin: Skin is warm and dry. Rash noted. Rash is maculopapular. She is not diaphoretic. There is erythema.  Psychiatric: Mood and affect normal.  Nursing note and vitals reviewed.     Assessment & Plan  Problem List Items Addressed This Visit    None    Visit Diagnoses    Tick bite, initial encounter    -  Primary   apply neosporin/triamcinolone   Relevant Medications   doxycycline (VIBRA-TABS) 100 MG tablet   triamcinolone cream (KENALOG) 0.1 %      Meds ordered this encounter  Medications  . doxycycline (VIBRA-TABS) 100 MG tablet    Sig: Take 1 tablet (100 mg total) by mouth 2 (two) times daily.    Dispense:  20 tablet    Refill:  0  . triamcinolone cream (KENALOG) 0.1 %    Sig: Apply 1 application topically 2 (two) times daily.    Dispense:  30 g    Refill:  0      Dr. [facility]  09/27/16

## 2017-01-23 ENCOUNTER — Encounter: Payer: Self-pay | Admitting: Family Medicine

## 2017-01-23 ENCOUNTER — Ambulatory Visit (INDEPENDENT_AMBULATORY_CARE_PROVIDER_SITE_OTHER): Payer: BC Managed Care – PPO | Admitting: Family Medicine

## 2017-01-23 VITALS — BP 138/78 | HR 76 | Ht 66.0 in | Wt 218.0 lb

## 2017-01-23 DIAGNOSIS — E781 Pure hyperglyceridemia: Secondary | ICD-10-CM

## 2017-01-23 DIAGNOSIS — Z23 Encounter for immunization: Secondary | ICD-10-CM

## 2017-01-23 DIAGNOSIS — I10 Essential (primary) hypertension: Secondary | ICD-10-CM

## 2017-01-23 DIAGNOSIS — R69 Illness, unspecified: Secondary | ICD-10-CM

## 2017-01-23 DIAGNOSIS — E119 Type 2 diabetes mellitus without complications: Secondary | ICD-10-CM

## 2017-01-23 DIAGNOSIS — F3341 Major depressive disorder, recurrent, in partial remission: Secondary | ICD-10-CM

## 2017-01-23 MED ORDER — BUPROPION HCL ER (XL) 300 MG PO TB24: 300.0000 mg | ORAL_TABLET | Freq: Every day | ORAL | 1 refills | Status: DC

## 2017-01-23 MED ORDER — METFORMIN HCL 500 MG PO TABS: 500.0000 mg | ORAL_TABLET | Freq: Two times a day (BID) | ORAL | 1 refills | Status: DC

## 2017-01-23 MED ORDER — METOPROLOL TARTRATE 50 MG PO TABS: 50.0000 mg | ORAL_TABLET | Freq: Two times a day (BID) | ORAL | 1 refills | Status: DC

## 2017-01-23 MED ORDER — GEMFIBROZIL 600 MG PO TABS: 600.0000 mg | ORAL_TABLET | Freq: Two times a day (BID) | ORAL | 1 refills | Status: DC

## 2017-01-23 NOTE — Progress Notes (Signed)
Name: Brandy Cummings   MRN: [id number]    DOB: 05/04/1955   Date:01/23/2017       Progress Note  Subjective  Chief Complaint  Chief Complaint  Patient presents with  . Depression  . Diabetes  . Hypertension  . Hyperlipidemia    Depression       The patient presents with depression.  This is a recurrent problem.  The current episode started more than 1 month ago.   The onset quality is sudden.   The problem occurs daily.  The problem has been gradually worsening since onset.  Associated symptoms include decreased interest and sad.  Associated symptoms include no decreased concentration, no fatigue, no helplessness, no hopelessness, does not have insomnia, not irritable, no restlessness, no appetite change, no body aches, no myalgias, no headaches, no indigestion and no suicidal ideas.  Past treatments include other medications (wellbutrin).  Compliance with treatment is good.  Previous treatment provided no relief relief.  Past medical history includes depression.   Diabetes  She presents for her follow-up diabetic visit. She has type 2 diabetes mellitus. Her disease course has been stable. Pertinent negatives for hypoglycemia include no confusion, dizziness, headaches, hunger, mood changes, nervousness/anxiousness, pallor, seizures, sleepiness, speech difficulty, sweats or tremors. Pertinent negatives for diabetes include no blurred vision, no chest pain, no fatigue, no foot paresthesias, no foot ulcerations, no polydipsia, no polyphagia, no polyuria, no visual change, no weakness and no weight loss. There are no hypoglycemic complications. Symptoms are stable. There are no diabetic complications. Pertinent negatives for diabetic complications include no CVA, PVD or retinopathy. There are no known risk factors for coronary artery disease. Current diabetic treatment includes oral agent (monotherapy). She is following a generally unhealthy diet. She participates in exercise daily. There is no change in  her home blood glucose trend. An ACE inhibitor/angiotensin II receptor blocker is not being taken. She does not see a podiatrist.Eye exam is not current.  Hypertension  Pertinent negatives include no blurred vision, chest pain, headaches, malaise/fatigue, neck pain, palpitations, shortness of breath or sweats. There are no associated agents to hypertension. Risk factors for coronary artery disease include post-menopausal state. Past treatments include beta blockers. The current treatment provides moderate improvement. There are no compliance problems.  There is no history of angina, kidney disease, CAD/MI, CVA, heart failure, left ventricular hypertrophy, PVD or retinopathy. There is no history of chronic renal disease, a hypertension causing med or renovascular disease.  Hyperlipidemia  This is a new problem. The current episode started more than 1 month ago. The problem is controlled. Recent lipid tests were reviewed and are normal. Exacerbating diseases include diabetes and obesity. She has no history of chronic renal disease. Pertinent negatives include no chest pain, focal sensory loss, focal weakness, leg pain, myalgias or shortness of breath. Current antihyperlipidemic treatment includes fibric acid derivatives.    No problem-specific Assessment & Plan notes found for this encounter.   Past Medical History:  Diagnosis Date  . Diabetes mellitus without complication (HCC)   . Hyperlipidemia   . Hypertension     Past Surgical History:  Procedure Laterality Date  . ANKLE FRACTURE SURGERY Left   . COLONOSCOPY  10/24/2010   cleared for 5 years- [facility]     Family History  Problem Relation Age of Onset  . Diabetes Mother   . Cancer Father   . Heart disease Father   . Cancer Maternal Grandfather   . Cancer Paternal Grandmother     Social History  Social History  . Marital status: Divorced    Spouse name: N/A  . Number of children: N/A  . Years of education: N/A    Occupational History  . Not on file.   Social History Main Topics  . Smoking status: Current Every Day Smoker    Packs/day: 1.00    Types: Cigarettes  . Smokeless tobacco: Never Used  . Alcohol use No  . Drug use: No  . Sexual activity: No   Other Topics Concern  . Not on file   Social History Narrative  . No narrative on file    No Known Allergies  Outpatient Medications Prior to Visit  Medication Sig Dispense Refill  . aspirin 81 MG tablet Take 1 tablet (81 mg total) by mouth daily. 30 tablet 11  . triamcinolone cream (KENALOG) 0.1 % Apply 1 application topically 2 (two) times daily. 30 g 0  . buPROPion (WELLBUTRIN XL) 300 MG 24 hr tablet Take 1 tablet (300 mg total) by mouth daily. 90 tablet 1  . gemfibrozil (LOPID) 600 MG tablet Take 1 tablet (600 mg total) by mouth 2 (two) times daily. 180 tablet 1  . metFORMIN (GLUCOPHAGE) 500 MG tablet Take 1 tablet (500 mg total) by mouth 2 (two) times daily. 180 tablet 1  . metoprolol (LOPRESSOR) 50 MG tablet Take 1 tablet (50 mg total) by mouth 2 (two) times daily. 180 tablet 1  . doxycycline (VIBRA-TABS) 100 MG tablet Take 1 tablet (100 mg total) by mouth 2 (two) times daily. 20 tablet 0   No facility-administered medications prior to visit.     Review of Systems  Constitutional: Negative for appetite change, chills, fatigue, fever, malaise/fatigue and weight loss.  HENT: Negative for ear discharge, ear pain and sore throat.   Eyes: Negative for blurred vision.  Respiratory: Negative for cough, sputum production, shortness of breath and wheezing.   Cardiovascular: Negative for chest pain, palpitations and leg swelling.  Gastrointestinal: Negative for abdominal pain, blood in stool, constipation, diarrhea, heartburn, melena and nausea.  Genitourinary: Negative for dysuria, frequency, hematuria and urgency.  Musculoskeletal: Negative for back pain, joint pain, myalgias and neck pain.  Skin: Negative for pallor and rash.   Neurological: Negative for dizziness, tingling, tremors, sensory change, focal weakness, seizures, speech difficulty, weakness and headaches.  Endo/Heme/Allergies: Negative for environmental allergies, polydipsia and polyphagia. Does not bruise/bleed easily.  Psychiatric/Behavioral: Positive for depression. Negative for confusion, decreased concentration and suicidal ideas. The patient is not nervous/anxious and does not have insomnia.      Objective  Vitals:   01/23/17 1009  BP: 138/78  Pulse: 76  Weight: 218 lb (98.9 kg)  Height:  (1.676 m)    Physical Exam  Constitutional: She is well-developed, well-nourished, and in no distress. She is not irritable. No distress.  HENT:  Head: Normocephalic and atraumatic.  Right Ear: External ear normal.  Left Ear: External ear normal.  Nose: Nose normal.  Mouth/Throat: Oropharynx is clear and moist.  Eyes: Pupils are equal, round, and reactive to light. Conjunctivae and EOM are normal. Right eye exhibits no discharge. Left eye exhibits no discharge.  Neck: Normal range of motion. Neck supple. No JVD present. No thyromegaly present.  Cardiovascular: Normal rate, regular rhythm, normal heart sounds and intact distal pulses.  Exam reveals no gallop and no friction rub.   No murmur heard. Pulmonary/Chest: Effort normal and breath sounds normal. She has no wheezes. She has no rales.  Abdominal: Soft. Bowel sounds are normal. She  exhibits no mass. There is no tenderness. There is no guarding.  Musculoskeletal: Normal range of motion. She exhibits no edema.  Lymphadenopathy:    She has no cervical adenopathy.  Neurological: She is alert. She has normal reflexes.  Skin: Skin is warm and dry. She is not diaphoretic.  Psychiatric: Mood and affect normal.  Nursing note and vitals reviewed.     Assessment & Plan  Problem List Items Addressed This Visit      Cardiovascular and Mediastinum   Essential hypertension   Relevant Medications    metoprolol tartrate (LOPRESSOR) 50 MG tablet   gemfibrozil (LOPID) 600 MG tablet   Other Relevant Orders   Renal Function Panel     Endocrine   Type 2 diabetes mellitus without complication, without long-term current use of insulin (HCC)   Relevant Medications   metFORMIN (GLUCOPHAGE) 500 MG tablet   Other Relevant Orders   Hemoglobin A1c     Other   Hyperlipidemia   Relevant Medications   metoprolol tartrate (LOPRESSOR) 50 MG tablet   gemfibrozil (LOPID) 600 MG tablet   Other Relevant Orders   Lipid Profile   Recurrent major depressive disorder, in partial remission (HCC)   Relevant Medications   buPROPion (WELLBUTRIN XL) 300 MG 24 hr tablet    Other Visit Diagnoses    Influenza vaccine needed    -  Primary   Relevant Orders   Flu Vaccine QUAD 36+ mos IM (Completed)   Taking medication for chronic disease       Relevant Orders   Hepatic function panel      Meds ordered this encounter  Medications  . metoprolol tartrate (LOPRESSOR) 50 MG tablet    Sig: Take 1 tablet (50 mg total) by mouth 2 (two) times daily.    Dispense:  180 tablet    Refill:  1  . gemfibrozil (LOPID) 600 MG tablet    Sig: Take 1 tablet (600 mg total) by mouth 2 (two) times daily.    Dispense:  180 tablet    Refill:  1  . buPROPion (WELLBUTRIN XL) 300 MG 24 hr tablet    Sig: Take 1 tablet (300 mg total) by mouth daily.    Dispense:  90 tablet    Refill:  1  . metFORMIN (GLUCOPHAGE) 500 MG tablet    Sig: Take 1 tablet (500 mg total) by mouth 2 (two) times daily.    Dispense:  180 tablet    Refill:  1      Dr. [facility]  01/23/17

## 2017-01-24 LAB — HEPATIC FUNCTION PANEL
ALT: 26 IU/L (ref 0–32)
AST: 30 IU/L (ref 0–40)
Alkaline Phosphatase: 85 IU/L (ref 39–117)
BILIRUBIN TOTAL: 0.2 mg/dL (ref 0.0–1.2)
Bilirubin, Direct: 0.08 mg/dL (ref 0.00–0.40)
Total Protein: 6.9 g/dL (ref 6.0–8.5)

## 2017-01-24 LAB — RENAL FUNCTION PANEL
ALBUMIN: 4.3 g/dL (ref 3.6–4.8)
BUN/Creatinine Ratio: 23 (ref 12–28)
BUN: 16 mg/dL (ref 8–27)
CALCIUM: 9.4 mg/dL (ref 8.7–10.3)
CHLORIDE: 104 mmol/L (ref 96–106)
CO2: 20 mmol/L (ref 20–29)
Creatinine, Ser: 0.69 mg/dL (ref 0.57–1.00)
GFR calc Af Amer: 109 mL/min/{1.73_m2} (ref >59)
GFR calc non Af Amer: 95 mL/min/{1.73_m2} (ref >59)
Glucose: 119 mg/dL — ABNORMAL HIGH (ref 65–99)
PHOSPHORUS: 4 mg/dL (ref 2.5–4.5)
Potassium: 4.9 mmol/L (ref 3.5–5.2)
Sodium: 142 mmol/L (ref 134–144)

## 2017-01-24 LAB — HEMOGLOBIN A1C
ESTIMATED AVERAGE GLUCOSE: 123 mg/dL
HEMOGLOBIN A1C: 5.9 % — AB (ref 4.8–5.6)

## 2017-01-24 LAB — LIPID PANEL
CHOLESTEROL TOTAL: 190 mg/dL (ref 100–199)
Chol/HDL Ratio: 4.8 ratio — ABNORMAL HIGH (ref 0.0–4.4)
HDL: 40 mg/dL (ref >39)
LDL Calculated: 109 mg/dL — ABNORMAL HIGH (ref 0–99)
TRIGLYCERIDES: 207 mg/dL — AB (ref 0–149)
VLDL CHOLESTEROL CAL: 41 mg/dL — AB (ref 5–40)

## 2017-08-01 ENCOUNTER — Ambulatory Visit: Payer: BC Managed Care – PPO | Admitting: Family Medicine

## 2017-08-02 ENCOUNTER — Encounter: Payer: Self-pay | Admitting: Family Medicine

## 2017-08-02 ENCOUNTER — Ambulatory Visit: Payer: BC Managed Care – PPO | Admitting: Family Medicine

## 2017-08-02 VITALS — BP 140/86 | HR 64 | Ht 66.0 in | Wt 220.0 lb

## 2017-08-02 DIAGNOSIS — F3341 Major depressive disorder, recurrent, in partial remission: Secondary | ICD-10-CM | POA: Diagnosis not present

## 2017-08-02 DIAGNOSIS — F17210 Nicotine dependence, cigarettes, uncomplicated: Secondary | ICD-10-CM | POA: Diagnosis not present

## 2017-08-02 DIAGNOSIS — E119 Type 2 diabetes mellitus without complications: Secondary | ICD-10-CM

## 2017-08-02 DIAGNOSIS — I10 Essential (primary) hypertension: Secondary | ICD-10-CM

## 2017-08-02 MED ORDER — METOPROLOL TARTRATE 50 MG PO TABS: 50.0000 mg | ORAL_TABLET | Freq: Two times a day (BID) | ORAL | 1 refills | Status: DC

## 2017-08-02 MED ORDER — METFORMIN HCL 500 MG PO TABS: 500.0000 mg | ORAL_TABLET | Freq: Two times a day (BID) | ORAL | 1 refills | Status: DC

## 2017-08-02 MED ORDER — VARENICLINE TARTRATE 0.5 MG PO TABS: 0.5000 mg | ORAL_TABLET | Freq: Two times a day (BID) | ORAL | 1 refills | Status: DC

## 2017-08-02 MED ORDER — BUPROPION HCL ER (XL) 300 MG PO TB24: 300.0000 mg | ORAL_TABLET | Freq: Every day | ORAL | 1 refills | Status: DC

## 2017-08-02 NOTE — Progress Notes (Signed)
Name: Brandy Cummings   MRN: [id number]    DOB: 12-Aug-1955   Date:08/02/2017       Progress Note  Subjective  Chief Complaint  Chief Complaint  Patient presents with  . Diabetes  . Hyperlipidemia  . Depression  . Hypertension    Diabetes  She presents for her follow-up diabetic visit. She has type 2 diabetes mellitus. Her disease course has been stable. There are no hypoglycemic associated symptoms. Pertinent negatives for hypoglycemia include no dizziness, headaches or nervousness/anxiousness. Pertinent negatives for diabetes include no blurred vision, no chest pain, no fatigue, no foot paresthesias, no foot ulcerations, no polydipsia, no polyphagia, no polyuria, no visual change, no weakness and no weight loss. There are no hypoglycemic complications. Symptoms are stable. There are no diabetic complications. There are no known risk factors for coronary artery disease. Current diabetic treatment includes oral agent (monotherapy). She is compliant with treatment all of the time. Her weight is stable. She is following a generally healthy diet. Meal planning includes avoidance of concentrated sweets. She participates in exercise intermittently. Her breakfast blood glucose is taken between 7-8 am. An ACE inhibitor/angiotensin II receptor blocker is being taken. Eye exam is not current.  Hyperlipidemia  This is a chronic problem. The current episode started more than 1 year ago. The problem is controlled. Recent lipid tests were reviewed and are normal. She has no history of chronic renal disease, diabetes, hypothyroidism, liver disease, obesity or nephrotic syndrome. Pertinent negatives include no chest pain, focal sensory loss, focal weakness, leg pain, myalgias or shortness of breath. Current antihyperlipidemic treatment includes statins. The current treatment provides moderate improvement of lipids. There are no compliance problems.  Risk factors for coronary artery disease include dyslipidemia,  hypertension and diabetes mellitus.  Depression         This is a chronic problem.  The current episode started more than 1 month ago.   The problem has been waxing and waning since onset.  Associated symptoms include no decreased concentration, no fatigue, no helplessness, no hopelessness, does not have insomnia, not irritable, no restlessness, no decreased interest, no appetite change, no body aches, no myalgias, no headaches, no indigestion, not sad and no suicidal ideas.  Treatments tried: welbutrin.  Compliance with treatment is good.  Past compliance problems include difficulty with treatment plan.  Previous treatment provided moderate relief.   Pertinent negatives include no hypothyroidism. Hypertension  Pertinent negatives include no blurred vision, chest pain, headaches, malaise/fatigue, neck pain, palpitations or shortness of breath. There is no history of chronic renal disease.    No problem-specific Assessment & Plan notes found for this encounter.   Past Medical History:  Diagnosis Date  . Diabetes mellitus without complication (HCC)   . Hyperlipidemia   . Hypertension     Past Surgical History:  Procedure Laterality Date  . ANKLE FRACTURE SURGERY Left   . COLONOSCOPY  10/24/2010   cleared for 5 years- [facility]     Family History  Problem Relation Age of Onset  . Diabetes Mother   . Cancer Father   . Heart disease Father   . Cancer Maternal Grandfather   . Cancer Paternal Grandmother     Social History   Socioeconomic History  . Marital status: Divorced    Spouse name: Not on file  . Number of children: Not on file  . Years of education: Not on file  . Highest education level: Not on file  Occupational History  . Not on file  Social  Needs  . Financial resource strain: Not on file  . Food insecurity:    Worry: Not on file    Inability: Not on file  . Transportation needs:    Medical: Not on file    Non-medical: Not on file  Tobacco Use  . Smoking  status: Current Every Day Smoker    Packs/day: 1.00    Types: Cigarettes  . Smokeless tobacco: Never Used  Substance and Sexual Activity  . Alcohol use: No    Alcohol/week: 0.0 oz  . Drug use: No  . Sexual activity: Never  Lifestyle  . Physical activity:    Days per week: Not on file    Minutes per session: Not on file  . Stress: Not on file  Relationships  . Social connections:    Talks on phone: Not on file    Gets together: Not on file    Attends religious service: Not on file    Active member of club or organization: Not on file    Attends meetings of clubs or organizations: Not on file    Relationship status: Not on file  . Intimate partner violence:    Fear of current or ex partner: Not on file    Emotionally abused: Not on file    Physically abused: Not on file    Forced sexual activity: Not on file  Other Topics Concern  . Not on file  Social History Narrative  . Not on file    No Known Allergies  Outpatient Medications Prior to Visit  Medication Sig Dispense Refill  . aspirin 81 MG tablet Take 1 tablet (81 mg total) by mouth daily. 30 tablet 11  . buPROPion (WELLBUTRIN XL) 300 MG 24 hr tablet Take 1 tablet (300 mg total) by mouth daily. 90 tablet 1  . gemfibrozil (LOPID) 600 MG tablet Take 1 tablet (600 mg total) by mouth 2 (two) times daily. 180 tablet 1  . metFORMIN (GLUCOPHAGE) 500 MG tablet Take 1 tablet (500 mg total) by mouth 2 (two) times daily. 180 tablet 1  . metoprolol tartrate (LOPRESSOR) 50 MG tablet Take 1 tablet (50 mg total) by mouth 2 (two) times daily. 180 tablet 1  . triamcinolone cream (KENALOG) 0.1 % Apply 1 application topically 2 (two) times daily. 30 g 0   No facility-administered medications prior to visit.     Review of Systems  Constitutional: Negative for appetite change, chills, fatigue, fever, malaise/fatigue and weight loss.  HENT: Negative for ear discharge, ear pain and sore throat.   Eyes: Negative for blurred vision.   Respiratory: Negative for cough, sputum production, shortness of breath and wheezing.   Cardiovascular: Negative for chest pain, palpitations and leg swelling.  Gastrointestinal: Negative for abdominal pain, blood in stool, constipation, diarrhea, heartburn, melena and nausea.  Genitourinary: Negative for dysuria, frequency, hematuria and urgency.  Musculoskeletal: Negative for back pain, joint pain, myalgias and neck pain.  Skin: Negative for rash.  Neurological: Negative for dizziness, tingling, sensory change, focal weakness, weakness and headaches.  Endo/Heme/Allergies: Negative for environmental allergies, polydipsia and polyphagia. Does not bruise/bleed easily.  Psychiatric/Behavioral: Positive for depression. Negative for decreased concentration and suicidal ideas. The patient is not nervous/anxious and does not have insomnia.      Objective  Vitals:   08/02/17 1342  BP: 140/86  Pulse: 64  Weight: 220 lb (99.8 kg)  Height: 5\' 6"  (1.676 m)    Physical Exam  Constitutional: She is not irritable. No distress.  HENT:  Head: Normocephalic and atraumatic.  Right Ear: External ear normal.  Left Ear: External ear normal.  Nose: Nose normal.  Mouth/Throat: Oropharynx is clear and moist.  Eyes: Pupils are equal, round, and reactive to light. Conjunctivae and EOM are normal. Right eye exhibits no discharge. Left eye exhibits no discharge.  Neck: Normal range of motion. Neck supple. No JVD present. No thyromegaly present.  Cardiovascular: Normal rate, regular rhythm, normal heart sounds and intact distal pulses. Exam reveals no gallop and no friction rub.  No murmur heard. Pulmonary/Chest: Effort normal and breath sounds normal. She has no wheezes. She has no rales.  Abdominal: Soft. Bowel sounds are normal. She exhibits no mass. There is no tenderness. There is no guarding.  Musculoskeletal: Normal range of motion. She exhibits no edema.  Lymphadenopathy:    She has no cervical  adenopathy.  Neurological: She is alert. She has normal reflexes.  Skin: Skin is warm and dry. She is not diaphoretic.  Nursing note and vitals reviewed.     Assessment & Plan  Problem List Items Addressed This Visit      Cardiovascular and Mediastinum   Essential hypertension   Relevant Medications   metoprolol tartrate (LOPRESSOR) 50 MG tablet     Endocrine   Type 2 diabetes mellitus without complication, without long-term current use of insulin (HCC)   Relevant Medications   metFORMIN (GLUCOPHAGE) 500 MG tablet     Other   Recurrent major depressive disorder, in partial remission (HCC)   Relevant Medications   buPROPion (WELLBUTRIN XL) 300 MG 24 hr tablet    Other Visit Diagnoses    Cigarette nicotine dependence without complication    -  Primary   Relevant Medications   varenicline (CHANTIX) 0.5 MG tablet      Meds ordered this encounter  Medications  . metoprolol tartrate (LOPRESSOR) 50 MG tablet    Sig: Take 1 tablet (50 mg total) by mouth 2 (two) times daily.    Dispense:  180 tablet    Refill:  1  . buPROPion (WELLBUTRIN XL) 300 MG 24 hr tablet    Sig: Take 1 tablet (300 mg total) by mouth daily.    Dispense:  90 tablet    Refill:  1  . metFORMIN (GLUCOPHAGE) 500 MG tablet    Sig: Take 1 tablet (500 mg total) by mouth 2 (two) times daily.    Dispense:  180 tablet    Refill:  1  . varenicline (CHANTIX) 0.5 MG tablet    Sig: Take 1 tablet (0.5 mg total) by mouth 2 (two) times daily.    Dispense:  60 tablet    Refill:  1  The 10-year ASCVD risk score Denman George DC Jr., et al., 2013) is: 23.5%   Values used to calculate the score:     Age: 24 years     Sex: Female     Is Non-Hispanic African American: No     Diabetic: Yes     Tobacco smoker: Yes     Systolic Blood Pressure: 140 mmHg     Is BP treated: Yes     HDL Cholesterol: 40 mg/dL     Total Cholesterol: 190 mg/dL    Dr. [facility]  08/02/17

## 2017-08-02 NOTE — Patient Instructions (Signed)
Varenicline oral tablets What is this medicine? VARENICLINE (var EN i kleen) is used to help people quit smoking. It can reduce the symptoms caused by stopping smoking. It is used with a patient support program recommended by your physician. This medicine may be used for other purposes; ask your health care provider or pharmacist if you have questions. COMMON BRAND NAME(S): Chantix What should I tell my health care provider before I take this medicine? They need to know if you have any of these conditions: -bipolar disorder, depression, schizophrenia or other mental illness -heart disease -if you often drink alcohol -kidney disease -peripheral vascular disease -seizures -stroke -suicidal thoughts, plans, or attempt; a previous suicide attempt by you or a family member -an unusual or allergic reaction to varenicline, other medicines, foods, dyes, or preservatives -pregnant or trying to get pregnant -breast-feeding How should I use this medicine? Take this medicine by mouth after eating. Take with a full glass of water. Follow the directions on the prescription label. Take your doses at regular intervals. Do not take your medicine more often than directed. There are 3 ways you can use this medicine to help you quit smoking; talk to your health care professional to decide which plan is right for you: 1) you can choose a quit date and start this medicine 1 week before the quit date, or, 2) you can start taking this medicine before you choose a quit date, and then pick a quit date between day 8 and 35 days of treatment, or, 3) if you are not sure that you are able or willing to quit smoking right away, start taking this medicine and slowly decrease the amount you smoke as directed by your health care professional with the goal of being cigarette-free by week 12 of treatment. Stick to your plan; ask about support groups or other ways to help you remain cigarette-free. If you are motivated to quit  smoking and did not succeed during a previous attempt with this medicine for reasons other than side effects, or if you returned to smoking after this treatment, speak with your health care professional about whether another course of this medicine may be right for you. A special MedGuide will be given to you by the pharmacist with each prescription and refill. Be sure to read this information carefully each time. Talk to your pediatrician regarding the use of this medicine in children. This medicine is not approved for use in children. Overdosage: If you think you have taken too much of this medicine contact a poison control center or emergency room at once. NOTE: This medicine is only for you. Do not share this medicine with others. What if I miss a dose? If you miss a dose, take it as soon as you can. If it is almost time for your next dose, take only that dose. Do not take double or extra doses. What may interact with this medicine? -alcohol or any product that contains alcohol -insulin -other stop smoking aids -theophylline -warfarin This list may not describe all possible interactions. Give your health care provider a list of all the medicines, herbs, non-prescription drugs, or dietary supplements you use. Also tell them if you smoke, drink alcohol, or use illegal drugs. Some items may interact with your medicine. What should I watch for while using this medicine? Visit your doctor or health care professional for regular check ups. Ask for ongoing advice and encouragement from your doctor or healthcare professional, friends, and family to help you quit. If   you smoke while on this medication, quit again Your mouth may get dry. Chewing sugarless gum or sucking hard candy, and drinking plenty of water may help. Contact your doctor if the problem does not go away or is severe. You may get drowsy or dizzy. Do not drive, use machinery, or do anything that needs mental alertness until you know how  this medicine affects you. Do not stand or sit up quickly, especially if you are an older patient. This reduces the risk of dizzy or fainting spells. Sleepwalking can happen during treatment with this medicine, and can sometimes lead to behavior that is harmful to you, other people, or property. Stop taking this medicine and tell your doctor if you start sleepwalking or have other unusual sleep-related activity. Decrease the amount of alcoholic beverages that you drink during treatment with this medicine until you know if this medicine affects your ability to tolerate alcohol. Some people have experienced increased drunkenness (intoxication), unusual or sometimes aggressive behavior, or no memory of things that have happened (amnesia) during treatment with this medicine. The use of this medicine may increase the chance of suicidal thoughts or actions. Pay special attention to how you are responding while on this medicine. Any worsening of mood, or thoughts of suicide or dying should be reported to your health care professional right away. What side effects may I notice from receiving this medicine? Side effects that you should report to your doctor or health care professional as soon as possible: -allergic reactions like skin rash, itching or hives, swelling of the face, lips, tongue, or throat -acting aggressive, being angry or violent, or acting on dangerous impulses -breathing problems -changes in vision -chest pain or chest tightness -confusion, trouble speaking or understanding -new or worsening depression, anxiety, or panic attacks -extreme increase in activity and talking (mania) -fast, irregular heartbeat -feeling faint or lightheaded, falls -fever -pain in legs when walking -problems with balance, talking, walking -redness, blistering, peeling or loosening of the skin, including inside the mouth -ringing in ears -seeing or hearing things that aren't there  (hallucinations) -seizures -sleepwalking -sudden numbness or weakness of the face, arm or leg -thoughts about suicide or dying, or attempts to commit suicide -trouble passing urine or change in the amount of urine -unusual bleeding or bruising -unusually weak or tired Side effects that usually do not require medical attention (report to your doctor or health care professional if they continue or are bothersome): -constipation -headache -nausea, vomiting -strange dreams -stomach gas -trouble sleeping This list may not describe all possible side effects. Call your doctor for medical advice about side effects. You may report side effects to FDA at 1-800-FDA-1088. Where should I keep my medicine? Keep out of the reach of children. Store at room temperature between 15 and 30 degrees C (59 and 86 degrees F). Throw away any unused medicine after the expiration date. NOTE: This sheet is a summary. It may not cover all possible information. If you have questions about this medicine, talk to your doctor, pharmacist, or health care provider.  2018 Elsevier/Gold Standard (2014-12-25 16:14:23)  

## 2018-02-15 ENCOUNTER — Ambulatory Visit: Payer: BC Managed Care – PPO | Admitting: Family Medicine

## 2018-02-15 ENCOUNTER — Encounter: Payer: Self-pay | Admitting: Family Medicine

## 2018-02-15 VITALS — BP 148/80 | HR 82 | Ht 66.0 in | Wt 225.0 lb

## 2018-02-15 DIAGNOSIS — E782 Mixed hyperlipidemia: Secondary | ICD-10-CM

## 2018-02-15 DIAGNOSIS — Z23 Encounter for immunization: Secondary | ICD-10-CM

## 2018-02-15 DIAGNOSIS — F17200 Nicotine dependence, unspecified, uncomplicated: Secondary | ICD-10-CM | POA: Diagnosis not present

## 2018-02-15 DIAGNOSIS — I10 Essential (primary) hypertension: Secondary | ICD-10-CM

## 2018-02-15 DIAGNOSIS — E119 Type 2 diabetes mellitus without complications: Secondary | ICD-10-CM

## 2018-02-15 MED ORDER — METOPROLOL TARTRATE 50 MG PO TABS: 50.0000 mg | ORAL_TABLET | Freq: Two times a day (BID) | ORAL | 1 refills | Status: DC

## 2018-02-15 MED ORDER — METFORMIN HCL 500 MG PO TABS: 500.0000 mg | ORAL_TABLET | Freq: Two times a day (BID) | ORAL | 1 refills | Status: DC

## 2018-02-15 NOTE — Progress Notes (Signed)
Date:  02/15/2018   Name:  Brandy Cummings   DOB:  May 30, 1955   MRN:  [id number]   Chief Complaint: Diabetes; Hypertension; Depression (PHQ=8); and Flu Vaccine Diabetes  She presents for her follow-up diabetic visit. She has type 2 diabetes mellitus. Her disease course has been stable. Pertinent negatives for hypoglycemia include no confusion, dizziness, headaches, hunger, mood changes, nervousness/anxiousness, pallor, seizures, sleepiness, speech difficulty or sweats. Pertinent negatives for diabetes include no blurred vision, no chest pain, no fatigue, no foot paresthesias, no foot ulcerations, no polydipsia, no polyphagia, no polyuria, no visual change, no weakness and no weight loss. There are no hypoglycemic complications. Symptoms are stable. There are no diabetic complications. Pertinent negatives for diabetic complications include no CVA, PVD or retinopathy. Risk factors for coronary artery disease include diabetes mellitus and hypertension. Current diabetic treatment includes oral agent (monotherapy). She is following a generally unhealthy diet. When asked about meal planning, she reported none.  Hypertension  This is a chronic problem. The problem is unchanged. The problem is controlled. Pertinent negatives include no anxiety, blurred vision, chest pain, headaches, malaise/fatigue, neck pain, orthopnea, palpitations, peripheral edema, PND, shortness of breath or sweats. There are no associated agents to hypertension. Risk factors for coronary artery disease include diabetes mellitus, smoking/tobacco exposure and obesity. Past treatments include beta blockers. The current treatment provides mild improvement. There are no compliance problems.  There is no history of angina, kidney disease, CAD/MI, CVA, heart failure, left ventricular hypertrophy, PVD or retinopathy. There is no history of chronic renal disease, a hypertension causing med or renovascular disease.  Depression         This is a  chronic problem.  The current episode started more than 1 year ago. The problem is unchanged.  Associated symptoms include helplessness, hopelessness, decreased interest and sad.  Associated symptoms include no fatigue, does not have insomnia, no myalgias and no headaches.  Treatments tried: stopped welbutrin.  Compliance with treatment is good.   Pertinent negatives include no anxiety.    Review of Systems  Constitutional: Negative.  Negative for chills, fatigue, fever, malaise/fatigue, unexpected weight change and weight loss.  HENT: Negative for congestion, ear discharge, ear pain, rhinorrhea, sinus pressure, sneezing and sore throat.   Eyes: Negative for blurred vision, photophobia, pain, discharge, redness and itching.  Respiratory: Negative for cough, shortness of breath, wheezing and stridor.   Cardiovascular: Negative for chest pain, palpitations, orthopnea and PND.  Gastrointestinal: Negative for abdominal pain, blood in stool, constipation, diarrhea, nausea and vomiting.  Endocrine: Negative for cold intolerance, heat intolerance, polydipsia, polyphagia and polyuria.  Genitourinary: Negative for dysuria, flank pain, frequency, hematuria, menstrual problem, pelvic pain, urgency, vaginal bleeding and vaginal discharge.  Musculoskeletal: Negative for arthralgias, back pain, myalgias and neck pain.  Skin: Negative for pallor and rash.  Allergic/Immunologic: Negative for environmental allergies and food allergies.  Neurological: Negative for dizziness, seizures, speech difficulty, weakness, light-headedness, numbness and headaches.  Hematological: Negative for adenopathy. Does not bruise/bleed easily.  Psychiatric/Behavioral: Positive for depression. Negative for confusion and dysphoric mood. The patient is not nervous/anxious and does not have insomnia.     Patient Active Problem List   Diagnosis Date Noted  . Recurrent major depressive disorder, in partial remission (HCC) 08/02/2016  .  Moderate episode of recurrent major depressive disorder (HCC) 08/02/2016  . Essential hypertension 01/26/2015  . Hyperlipidemia 01/26/2015  . Type 2 diabetes mellitus without complication, without long-term current use of insulin (HCC) 01/26/2015    No Known  Allergies  Past Surgical History:  Procedure Laterality Date  . ANKLE FRACTURE SURGERY Left   . COLONOSCOPY  10/24/2010   cleared for 5 years- [place]     Social History   Tobacco Use  . Smoking status: Current Every Day Smoker    Packs/day: 1.00    Types: Cigarettes  . Smokeless tobacco: Never Used  Substance Use Topics  . Alcohol use: No    Alcohol/week: 0.0 standard drinks  . Drug use: No     Medication list has been reviewed and updated.  Current Meds  Medication Sig  . aspirin 81 MG tablet Take 1 tablet (81 mg total) by mouth daily.  . metFORMIN (GLUCOPHAGE) 500 MG tablet Take 1 tablet (500 mg total) by mouth 2 (two) times daily.  . metoprolol tartrate (LOPRESSOR) 50 MG tablet Take 1 tablet (50 mg total) by mouth 2 (two) times daily.  . [DISCONTINUED] metFORMIN (GLUCOPHAGE) 500 MG tablet Take 1 tablet (500 mg total) by mouth 2 (two) times daily.  . [DISCONTINUED] metoprolol tartrate (LOPRESSOR) 50 MG tablet Take 1 tablet (50 mg total) by mouth 2 (two) times daily.    PHQ 2/9 Scores 02/15/2018 07/27/2015 01/26/2015  PHQ - 2 Score 2 0 1  PHQ- 9 Score 8 - -    Physical Exam  Constitutional: She is oriented to person, place, and time. She appears well-developed and well-nourished.  HENT:  Head: Normocephalic.  Right Ear: External ear normal.  Left Ear: External ear normal.  Mouth/Throat: Oropharynx is clear and moist.  Eyes: Pupils are equal, round, and reactive to light. Conjunctivae and EOM are normal. Lids are everted and swept, no foreign bodies found. Left eye exhibits no hordeolum. No foreign body present in the left eye. Right conjunctiva is not injected. Left conjunctiva is not injected. No scleral  icterus.  Neck: Normal range of motion. Neck supple. No JVD present. No tracheal deviation present. No thyromegaly present.  Cardiovascular: Normal rate, regular rhythm, normal heart sounds and intact distal pulses. Exam reveals no gallop and no friction rub.  No murmur heard. Pulmonary/Chest: Effort normal and breath sounds normal. No respiratory distress. She has no wheezes. She has no rales.  Abdominal: Soft. Bowel sounds are normal. She exhibits no mass. There is no hepatosplenomegaly. There is no tenderness. There is no rebound and no guarding.  Musculoskeletal: Normal range of motion. She exhibits no edema or tenderness.  Lymphadenopathy:    She has no cervical adenopathy.  Neurological: She is alert and oriented to person, place, and time. She has normal strength. She displays normal reflexes. No cranial nerve deficit.  Skin: Skin is warm. No rash noted.  Psychiatric: She has a normal mood and affect. Her mood appears not anxious. She does not exhibit a depressed mood.  Nursing note and vitals reviewed.   BP (!) 148/80   Pulse 82   Ht 5\' 6"  (1.676 m)   Wt 225 lb (102.1 kg)   BMI 36.32 kg/m   Assessment and Plan: 1. Tobacco dependence Patient has been advised of the health risks of smoking and counseled concerning cessation of tobacco products. I spent over 3 minutes for discussion and to answer questions.  2. Essential hypertension Chronic Controlled. Stable. Continue Metoprolol 50 mg bid.Check renal panel. - Renal Function Panel - metoprolol tartrate (LOPRESSOR) 50 MG tablet; Take 1 tablet (50 mg total) by mouth 2 (two) times daily.  Dispense: 180 tablet; Refill: 1  3. Type 2 diabetes mellitus without complication, without long-term current  use of insulin (HCC) Chronic Uncertain control. Continue metformen 500 mg bid. Check A1C - Hemoglobin A1c - Lipid panel - metFORMIN (GLUCOPHAGE) 500 MG tablet; Take 1 tablet (500 mg total) by mouth 2 (two) times daily.  Dispense: 180  tablet; Refill: 1  4. Mixed hyperlipidemia Chronic Diet controlled Check lipid panel - Lipid panel  5. Flu vaccine need Discussed and administered - Flu Vaccine QUAD 6+ mos PF IM (Fluarix Quad PF)  6. Depression Uncontrolled but patient stopped bupropion and does wish to restart.   Dr. Hayden [facility]  02/15/2018

## 2018-02-16 ENCOUNTER — Other Ambulatory Visit: Payer: Self-pay

## 2018-02-16 DIAGNOSIS — E119 Type 2 diabetes mellitus without complications: Secondary | ICD-10-CM

## 2018-02-16 LAB — RENAL FUNCTION PANEL
ALBUMIN: 4.3 g/dL (ref 3.6–4.8)
BUN/Creatinine Ratio: 13 (ref 12–28)
BUN: 9 mg/dL (ref 8–27)
CHLORIDE: 100 mmol/L (ref 96–106)
CO2: 22 mmol/L (ref 20–29)
Calcium: 9.5 mg/dL (ref 8.7–10.3)
Creatinine, Ser: 0.69 mg/dL (ref 0.57–1.00)
GFR calc non Af Amer: 94 mL/min/{1.73_m2} (ref >59)
GFR, EST AFRICAN AMERICAN: 109 mL/min/{1.73_m2} (ref >59)
GLUCOSE: 111 mg/dL — AB (ref 65–99)
POTASSIUM: 4.6 mmol/L (ref 3.5–5.2)
Phosphorus: 3 mg/dL (ref 2.5–4.5)
SODIUM: 139 mmol/L (ref 134–144)

## 2018-02-16 LAB — LIPID PANEL
CHOLESTEROL TOTAL: 224 mg/dL — AB (ref 100–199)
Chol/HDL Ratio: 4.7 ratio — ABNORMAL HIGH (ref 0.0–4.4)
HDL: 48 mg/dL (ref >39)
LDL CALC: 145 mg/dL — AB (ref 0–99)
Triglycerides: 156 mg/dL — ABNORMAL HIGH (ref 0–149)
VLDL CHOLESTEROL CAL: 31 mg/dL (ref 5–40)

## 2018-02-16 LAB — HEMOGLOBIN A1C
Est. average glucose Bld gHb Est-mCnc: 120 mg/dL
Hgb A1c MFr Bld: 5.8 % — ABNORMAL HIGH (ref 4.8–5.6)

## 2018-02-16 MED ORDER — METFORMIN HCL ER 500 MG PO TB24: 500.0000 mg | ORAL_TABLET | Freq: Every day | ORAL | 1 refills | Status: DC

## 2018-04-19 ENCOUNTER — Other Ambulatory Visit: Payer: Self-pay | Admitting: Family Medicine

## 2018-04-19 DIAGNOSIS — E119 Type 2 diabetes mellitus without complications: Secondary | ICD-10-CM

## 2018-05-16 ENCOUNTER — Other Ambulatory Visit: Payer: Self-pay | Admitting: Family Medicine

## 2018-05-16 DIAGNOSIS — E119 Type 2 diabetes mellitus without complications: Secondary | ICD-10-CM

## 2018-05-31 ENCOUNTER — Encounter: Payer: Self-pay | Admitting: Family Medicine

## 2018-05-31 ENCOUNTER — Ambulatory Visit: Payer: BC Managed Care – PPO | Admitting: Family Medicine

## 2018-05-31 VITALS — BP 140/88 | HR 72 | Ht 66.0 in | Wt 230.0 lb

## 2018-05-31 DIAGNOSIS — Z6837 Body mass index (BMI) 37.0-37.9, adult: Secondary | ICD-10-CM

## 2018-05-31 DIAGNOSIS — F17210 Nicotine dependence, cigarettes, uncomplicated: Secondary | ICD-10-CM | POA: Diagnosis not present

## 2018-05-31 DIAGNOSIS — E119 Type 2 diabetes mellitus without complications: Secondary | ICD-10-CM

## 2018-05-31 DIAGNOSIS — I10 Essential (primary) hypertension: Secondary | ICD-10-CM

## 2018-05-31 DIAGNOSIS — Z8742 Personal history of other diseases of the female genital tract: Secondary | ICD-10-CM

## 2018-05-31 DIAGNOSIS — E782 Mixed hyperlipidemia: Secondary | ICD-10-CM

## 2018-05-31 MED ORDER — METOPROLOL TARTRATE 50 MG PO TABS: 50.0000 mg | ORAL_TABLET | Freq: Two times a day (BID) | ORAL | 1 refills | Status: DC

## 2018-05-31 MED ORDER — METFORMIN HCL ER 500 MG PO TB24: ORAL_TABLET | ORAL | 1 refills | Status: DC

## 2018-05-31 NOTE — Patient Instructions (Signed)

## 2018-05-31 NOTE — Progress Notes (Signed)
Date:  05/31/2018   Name:  Marguerite Oleaancy Carstens   DOB:  09/21/1955   MRN:  [id number]   Chief Complaint: Diabetes (needs foot exam/ has not had diab eye exam- pt will schedule) and Hypertension  Diabetes  She presents for her follow-up diabetic visit. She has type 2 diabetes mellitus. Her disease course has been stable. There are no hypoglycemic associated symptoms. Pertinent negatives for hypoglycemia include no dizziness, headaches, nervousness/anxiousness or sweats. Associated symptoms include polydipsia and polyuria. Pertinent negatives for diabetes include no blurred vision, no chest pain, no fatigue, no foot paresthesias, no foot ulcerations, no polyphagia, no visual change, no weakness and no weight loss. There are no hypoglycemic complications. Symptoms are stable. There are no diabetic complications. Risk factors for coronary artery disease include dyslipidemia, diabetes mellitus, hypertension, obesity, tobacco exposure and post-menopausal. Current diabetic treatment includes oral agent (monotherapy). She is compliant with treatment all of the time. Her weight is increasing steadily. She is following a generally healthy diet. Meal planning includes avoidance of concentrated sweets and carbohydrate counting. She rarely participates in exercise. Her home blood glucose trend is fluctuating minimally. (Machine is broken) An ACE inhibitor/angiotensin II receptor blocker is not being taken. Eye exam is not current.  Hypertension  This is a chronic problem. The current episode started more than 1 year ago. The problem has been gradually improving since onset. The problem is controlled. Pertinent negatives include no anxiety, blurred vision, chest pain, headaches, malaise/fatigue, neck pain, orthopnea, palpitations, peripheral edema, PND, shortness of breath or sweats. There are no associated agents to hypertension. Past treatments include beta blockers. There is no history of chronic renal disease.    Hyperlipidemia  This is a chronic problem. The current episode started more than 1 year ago. The problem is controlled. Recent lipid tests were reviewed and are normal. She has no history of chronic renal disease, diabetes, hypothyroidism, liver disease, obesity or nephrotic syndrome. Pertinent negatives include no chest pain, myalgias or shortness of breath. She is currently on no antihyperlipidemic treatment. The current treatment provides no improvement of lipids. There are no compliance problems.  Risk factors for coronary artery disease include diabetes mellitus, dyslipidemia, hypertension, post-menopausal and obesity.    Review of Systems  Constitutional: Negative.  Negative for chills, fatigue, fever, malaise/fatigue, unexpected weight change and weight loss.  HENT: Negative for congestion, ear discharge, ear pain, rhinorrhea, sinus pressure, sneezing and sore throat.   Eyes: Negative for blurred vision, photophobia, pain, discharge, redness and itching.  Respiratory: Negative for cough, shortness of breath, wheezing and stridor.   Cardiovascular: Negative for chest pain, palpitations, orthopnea and PND.  Gastrointestinal: Negative for abdominal pain, blood in stool, constipation, diarrhea, nausea and vomiting.  Endocrine: Positive for polydipsia and polyuria. Negative for cold intolerance, heat intolerance and polyphagia.  Genitourinary: Negative for dysuria, flank pain, frequency, hematuria, menstrual problem, pelvic pain, urgency, vaginal bleeding and vaginal discharge.  Musculoskeletal: Negative for arthralgias, back pain, myalgias and neck pain.  Skin: Negative for rash.  Allergic/Immunologic: Negative for environmental allergies and food allergies.  Neurological: Negative for dizziness, weakness, light-headedness, numbness and headaches.  Hematological: Negative for adenopathy. Does not bruise/bleed easily.  Psychiatric/Behavioral: Negative for dysphoric mood. The patient is not  nervous/anxious.     Patient Active Problem List   Diagnosis Date Noted  . Recurrent major depressive disorder, in partial remission (HCC) 08/02/2016  . Moderate episode of recurrent major depressive disorder (HCC) 08/02/2016  . Essential hypertension 01/26/2015  . Hyperlipidemia 01/26/2015  .  Type 2 diabetes mellitus without complication, without long-term current use of insulin (HCC) 01/26/2015    No Known Allergies  Past Surgical History:  Procedure Laterality Date  . ANKLE FRACTURE SURGERY Left   . COLONOSCOPY  10/24/2010   cleared for 5 years- [place]     Social History   Tobacco Use  . Smoking status: Current Every Day Smoker    Packs/day: 1.00    Types: Cigarettes  . Smokeless tobacco: Never Used  Substance Use Topics  . Alcohol use: No    Alcohol/week: 0.0 standard drinks  . Drug use: No     Medication list has been reviewed and updated.  Current Meds  Medication Sig  . aspirin 81 MG tablet Take 1 tablet (81 mg total) by mouth daily.  . metFORMIN (GLUCOPHAGE-XR) 500 MG 24 hr tablet TAKE 1 TABLET BY MOUTH ONCE DAILY WITH BREAKFAST  . metoprolol tartrate (LOPRESSOR) 50 MG tablet Take 1 tablet (50 mg total) by mouth 2 (two) times daily.    PHQ 2/9 Scores 02/15/2018 07/27/2015 01/26/2015  PHQ - 2 Score 2 0 1  PHQ- 9 Score 8 - -    Physical Exam Vitals signs and nursing note reviewed.  Constitutional:      General: She is not in acute distress.    Appearance: She is obese. She is not diaphoretic.  HENT:     Head: Normocephalic and atraumatic.     Jaw: There is normal jaw occlusion.     Right Ear: Hearing, tympanic membrane, ear canal and external ear normal.     Left Ear: Hearing, tympanic membrane, ear canal and external ear normal.     Nose: Nose normal.     Mouth/Throat:     Mouth: Mucous membranes are moist.     Pharynx: Oropharynx is clear.  Eyes:     General:        Right eye: No discharge.        Left eye: No discharge.      Conjunctiva/sclera: Conjunctivae normal.     Pupils: Pupils are equal, round, and reactive to light.  Neck:     Musculoskeletal: Full passive range of motion without pain, normal range of motion and neck supple.     Thyroid: No thyroid mass, thyromegaly or thyroid tenderness.     Vascular: No JVD.  Cardiovascular:     Rate and Rhythm: Normal rate and regular rhythm.     Chest Wall: PMI is not displaced.     Pulses: No decreased pulses.     Heart sounds: Normal heart sounds, S1 normal and S2 normal. No murmur. No systolic murmur. No diastolic murmur. No friction rub. No gallop. No S3 or S4 sounds.   Pulmonary:     Effort: Pulmonary effort is normal.     Breath sounds: Normal breath sounds.  Abdominal:     General: Bowel sounds are normal.     Palpations: Abdomen is soft. There is no mass.     Tenderness: There is no abdominal tenderness. There is no guarding.  Musculoskeletal: Normal range of motion.  Lymphadenopathy:     Cervical: No cervical adenopathy.  Skin:    General: Skin is warm and dry.  Neurological:     Mental Status: She is alert.     Deep Tendon Reflexes: Reflexes are normal and symmetric.     BP 140/88   Pulse 72   Ht 5\' 6"  (1.676 m)   Wt 230 lb (104.3 kg)   BMI  37.12 kg/m   Assessment and Plan: 1. Cigarette nicotine dependence without complication Patient has been advised of the health risks of smoking and counseled concerning cessation of tobacco products. I spent over 3 minutes for discussion and to answer questions.  2. BMI 37.0-37.9, adult Health risks of being over weight were discussed and patient was counseled on weight loss options and exercise.  3. Essential hypertension Chronic.  Controlled.  Will check renal function panel and refill Lopressor 50 mg twice a day. - Renal Function Panel - metoprolol tartrate (LOPRESSOR) 50 MG tablet; Take 1 tablet (50 mg total) by mouth 2 (two) times daily.  Dispense: 180 tablet; Refill: 1  4. Type 2 diabetes  mellitus without complication, without long-term current use of insulin (HCC) Type 2 diabetes which is chronic controlled we will check A1c and microalbuminuria and renal function panel will continue metformin 500 mg XR once a day foot exam is normal. - Microalbumin, urine - HgB A1c - Renal Function Panel - metFORMIN (GLUCOPHAGE-XR) 500 MG 24 hr tablet; TAKE 1 TABLET BY MOUTH ONCE DAILY WITH BREAKFAST  Dispense: 90 tablet; Refill: 1  5. 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 Tommye Standard she stated that she will call him concerning Pap smear and/or further evaluation.  6. Class 2 severe obesity due to excess calories with serious comorbidity and body mass index (BMI) of 37.0 to 37.9 in adult [facility]) As noted patient does have serious medical morbidity i.e. diabetes with her overweight status and that a triglyceride low-cholesterol diet was given for control.  7. Mixed hyperlipidemia Controlled.  Chronic will check lipid panel. - Lipid panel

## 2018-06-01 LAB — LIPID PANEL
Chol/HDL Ratio: 5.9 ratio — ABNORMAL HIGH (ref 0.0–4.4)
Cholesterol, Total: 200 mg/dL — ABNORMAL HIGH (ref 100–199)
HDL: 34 mg/dL — ABNORMAL LOW (ref >39)
LDL Calculated: 117 mg/dL — ABNORMAL HIGH (ref 0–99)
Triglycerides: 246 mg/dL — ABNORMAL HIGH (ref 0–149)
VLDL Cholesterol Cal: 49 mg/dL — ABNORMAL HIGH (ref 5–40)

## 2018-06-01 LAB — RENAL FUNCTION PANEL
Albumin: 4 g/dL (ref 3.8–4.8)
BUN / CREAT RATIO: 13 (ref 12–28)
BUN: 8 mg/dL (ref 8–27)
CALCIUM: 9 mg/dL (ref 8.7–10.3)
CHLORIDE: 101 mmol/L (ref 96–106)
CO2: 21 mmol/L (ref 20–29)
Creatinine, Ser: 0.61 mg/dL (ref 0.57–1.00)
GFR calc Af Amer: 113 mL/min/{1.73_m2} (ref >59)
GFR calc non Af Amer: 98 mL/min/{1.73_m2} (ref >59)
Glucose: 115 mg/dL — ABNORMAL HIGH (ref 65–99)
Phosphorus: 3.5 mg/dL (ref 3.0–4.3)
Potassium: 4.1 mmol/L (ref 3.5–5.2)
Sodium: 138 mmol/L (ref 134–144)

## 2018-06-01 LAB — HEMOGLOBIN A1C
ESTIMATED AVERAGE GLUCOSE: 131 mg/dL
HEMOGLOBIN A1C: 6.2 % — AB (ref 4.8–5.6)

## 2018-06-01 LAB — MICROALBUMIN, URINE: Microalbumin, Urine: 12.6 ug/mL

## 2018-06-07 LAB — HM PAP SMEAR: HM Pap smear: NEGATIVE

## 2018-06-07 LAB — RESULTS CONSOLE HPV: CHL HPV: NEGATIVE

## 2018-06-21 LAB — HM MAMMOGRAPHY

## 2018-06-29 ENCOUNTER — Other Ambulatory Visit: Payer: Self-pay | Admitting: Family Medicine

## 2018-10-15 ENCOUNTER — Telehealth: Payer: Self-pay

## 2018-10-15 NOTE — Telephone Encounter (Signed)
Pt wanted a referral to surgery after being told last week to go to ER for multiple areas of drainage/ abscesses. Pt didn't go to ER, but called [facility] this am. She called asking for a referral- I spoke to [place] at surgery who said if she was told to go to ER last week and has multiple places, she needs to go there for eval. I spoke to pt concerning this/ she voiced understanding though sounded very reluctant

## 2018-11-28 ENCOUNTER — Ambulatory Visit: Payer: BC Managed Care – PPO | Admitting: Family Medicine

## 2018-11-28 ENCOUNTER — Encounter: Payer: Self-pay | Admitting: Family Medicine

## 2018-11-28 ENCOUNTER — Other Ambulatory Visit: Payer: Self-pay

## 2018-11-28 ENCOUNTER — Other Ambulatory Visit
Admission: RE | Admit: 2018-11-28 | Discharge: 2018-11-28 | Disposition: A | Discharge status: Home / Self Care | Payer: BC Managed Care – PPO | Attending: Family Medicine | Admitting: Family Medicine

## 2018-11-28 VITALS — BP 138/80 | HR 80 | Ht 65.0 in | Wt 205.0 lb

## 2018-11-28 DIAGNOSIS — K611 Rectal abscess: Secondary | ICD-10-CM | POA: Diagnosis not present

## 2018-11-28 DIAGNOSIS — N760 Acute vaginitis: Secondary | ICD-10-CM

## 2018-11-28 DIAGNOSIS — F17210 Nicotine dependence, cigarettes, uncomplicated: Secondary | ICD-10-CM

## 2018-11-28 DIAGNOSIS — Z1211 Encounter for screening for malignant neoplasm of colon: Secondary | ICD-10-CM | POA: Diagnosis not present

## 2018-11-28 DIAGNOSIS — L0291 Cutaneous abscess, unspecified: Secondary | ICD-10-CM | POA: Insufficient documentation

## 2018-11-28 DIAGNOSIS — N390 Urinary tract infection, site not specified: Secondary | ICD-10-CM

## 2018-11-28 LAB — CBC WITH DIFFERENTIAL/PLATELET
Abs Immature Granulocytes: 0.02 10*3/uL (ref 0.00–0.07)
Basophils Absolute: 0.1 10*3/uL (ref 0.0–0.1)
Basophils Relative: 1 %
Eosinophils Absolute: 0.2 10*3/uL (ref 0.0–0.5)
Eosinophils Relative: 3 %
HCT: 48.1 % — ABNORMAL HIGH (ref 36.0–46.0)
Hemoglobin: 16.1 g/dL — ABNORMAL HIGH (ref 12.0–15.0)
Immature Granulocytes: 0 %
Lymphocytes Relative: 34 %
Lymphs Abs: 2.8 10*3/uL (ref 0.7–4.0)
MCH: 34.3 pg — ABNORMAL HIGH (ref 26.0–34.0)
MCHC: 33.5 g/dL (ref 30.0–36.0)
MCV: 102.3 fL — ABNORMAL HIGH (ref 80.0–100.0)
Monocytes Absolute: 0.4 10*3/uL (ref 0.1–1.0)
Monocytes Relative: 5 %
Neutro Abs: 4.6 10*3/uL (ref 1.7–7.7)
Neutrophils Relative %: 57 %
Platelets: 215 10*3/uL (ref 150–400)
RBC: 4.7 MIL/uL (ref 3.87–5.11)
RDW: 16.1 % — ABNORMAL HIGH (ref 11.5–15.5)
WBC: 8.1 10*3/uL (ref 4.0–10.5)
nRBC: 0 % (ref 0.0–0.2)

## 2018-11-28 LAB — POCT URINALYSIS DIPSTICK
Bilirubin, UA: NEGATIVE
Glucose, UA: NEGATIVE
Ketones, UA: NEGATIVE
Nitrite, UA: NEGATIVE
Protein, UA: NEGATIVE
Spec Grav, UA: 1.02 (ref 1.010–1.025)
Urobilinogen, UA: 0.2 E.U./dL
pH, UA: 6 (ref 5.0–8.0)

## 2018-11-28 LAB — RENAL FUNCTION PANEL
Albumin: 3.7 g/dL (ref 3.5–5.0)
Anion gap: 10 (ref 5–15)
BUN: 5 mg/dL — ABNORMAL LOW (ref 8–23)
CO2: 22 mmol/L (ref 22–32)
Calcium: 8.9 mg/dL (ref 8.9–10.3)
Chloride: 105 mmol/L (ref 98–111)
Creatinine, Ser: 0.5 mg/dL (ref 0.44–1.00)
GFR calc Af Amer: >60 mL/min (ref >60)
GFR calc non Af Amer: >60 mL/min (ref >60)
Glucose, Bld: 123 mg/dL — ABNORMAL HIGH (ref 70–99)
Phosphorus: 4.4 mg/dL (ref 2.5–4.6)
Potassium: 4.1 mmol/L (ref 3.5–5.1)
Sodium: 137 mmol/L (ref 135–145)

## 2018-11-28 LAB — HEMOCCULT GUIAC POC 1CARD (OFFICE): Fecal Occult Blood, POC: NEGATIVE

## 2018-11-28 NOTE — Progress Notes (Signed)
Date:  11/28/2018   Name:  Brandy Cummings   DOB:  03-01-1956   MRN:  [id number]   Chief Complaint: Follow-up (pt has multiple abscesses, yet refuses to got to ER despite being told by [place] and general surgery- going on since May)  Patient is a 63 year old female who presents for a " gluteal abscess" exam. The patient reports the following problems: history perirectal abscess. Health maintenance has been reviewed see HM.   Been taking antibiotics from sister 27 yo cipro to another round of cipro.   Review of Systems  Constitutional: Negative.  Negative for chills, fatigue, fever and unexpected weight change.  HENT: Negative for congestion, ear discharge, ear pain, rhinorrhea, sinus pressure, sneezing and sore throat.   Eyes: Negative for photophobia, pain, discharge, redness and itching.  Respiratory: Negative for cough, shortness of breath, wheezing and stridor.   Gastrointestinal: Negative for abdominal pain, blood in stool, constipation, diarrhea, nausea and vomiting.  Endocrine: Negative for cold intolerance, heat intolerance, polydipsia, polyphagia and polyuria.  Genitourinary: Negative for dysuria, flank pain, frequency, hematuria, menstrual problem, pelvic pain, urgency, vaginal bleeding and vaginal discharge.  Musculoskeletal: Negative for arthralgias, back pain and myalgias.  Skin: Negative for rash.  Allergic/Immunologic: Negative for environmental allergies and food allergies.  Neurological: Negative for dizziness, weakness, light-headedness, numbness and headaches.  Hematological: Negative for adenopathy. Does not bruise/bleed easily.  Psychiatric/Behavioral: Negative for dysphoric mood. The patient is not nervous/anxious.     Patient Active Problem List   Diagnosis Date Noted  . Recurrent major depressive disorder, in partial remission ([place]) 08/02/2016  . Moderate episode of recurrent major depressive disorder ([place]) 08/02/2016  . Essential hypertension 01/26/2015  .  Hyperlipidemia 01/26/2015  . Type 2 diabetes mellitus without complication, without long-term current use of insulin ([place]) 01/26/2015    No Known Allergies  Past Surgical History:  Procedure Laterality Date  . ANKLE FRACTURE SURGERY Left   . COLONOSCOPY  10/24/2010   cleared for 5 years- [place]     Social History   Tobacco Use  . Smoking status: Current Every Day Smoker    Packs/day: 1.00    Types: Cigarettes  . Smokeless tobacco: Never Used  Substance Use Topics  . Alcohol use: No    Alcohol/week: 0.0 standard drinks  . Drug use: No     Medication list has been reviewed and updated.  Current Meds  Medication Sig  . aspirin 81 MG tablet Take 1 tablet (81 mg total) by mouth daily.  . metFORMIN (GLUCOPHAGE-XR) 500 MG 24 hr tablet TAKE 1 TABLET BY MOUTH ONCE DAILY WITH BREAKFAST  . metoprolol tartrate (LOPRESSOR) 50 MG tablet Take 1 tablet (50 mg total) by mouth 2 (two) times daily.    PHQ 2/9 Scores 02/15/2018 07/27/2015 01/26/2015  PHQ - 2 Score 2 0 1  PHQ- 9 Score 8 - -    BP Readings from Last 3 Encounters:  11/28/18 138/80  05/31/18 140/88  02/15/18 (!) 148/80    Physical Exam Vitals signs and nursing note reviewed. Exam conducted with a chaperone present.  Constitutional:      General: She is not in acute distress.    Appearance: She is not diaphoretic.  HENT:     Head: Normocephalic and atraumatic.     Right Ear: Tympanic membrane, ear canal and external ear normal.     Left Ear: Tympanic membrane, ear canal and external ear normal.     Nose: Nose normal. No congestion.  Mouth/Throat:     Mouth: Mucous membranes are moist.     Pharynx: No oropharyngeal exudate or posterior oropharyngeal erythema.  Eyes:     General:        Right eye: No discharge.        Left eye: No discharge.     Extraocular Movements: Extraocular movements intact.     Conjunctiva/sclera: Conjunctivae normal.     Pupils: Pupils are equal, round, and reactive to light.  Neck:      Musculoskeletal: Normal range of motion and neck supple.     Thyroid: No thyromegaly.     Vascular: No JVD.  Cardiovascular:     Rate and Rhythm: Normal rate and regular rhythm.     Heart sounds: Normal heart sounds. No murmur. No friction rub. No gallop.   Pulmonary:     Effort: Pulmonary effort is normal.     Breath sounds: Normal breath sounds. No wheezing or rhonchi.  Abdominal:     General: Bowel sounds are normal.     Palpations: Abdomen is soft. There is no mass.     Tenderness: There is no abdominal tenderness. There is no right CVA tenderness, left CVA tenderness or guarding.  Genitourinary:    General: Normal vulva.     Vagina: Vaginal discharge present.     Rectum: Guaiac result negative.     Comments: Purulent discharge/vaginal   Rectal / no tenderness/ no palpable mass/ purulent odor on DRE Musculoskeletal: Normal range of motion.  Lymphadenopathy:     Cervical: No cervical adenopathy.     Lower Body: No right inguinal adenopathy. No left inguinal adenopathy.  Skin:    General: Skin is warm and dry.  Neurological:     Mental Status: She is alert.     Deep Tendon Reflexes: Reflexes are normal and symmetric.     Wt Readings from Last 3 Encounters:  11/28/18 205 lb (93 kg)  05/31/18 230 lb (104.3 kg)  02/15/18 225 lb (102.1 kg)    BP 138/80   Pulse 80   Ht 5\' 5"  (1.651 m)   Wt 205 lb (93 kg)   BMI 34.11 kg/m   Assessment and Plan:   1. Cigarette nicotine dependence without complication Patient has been advised of the health risks of smoking and counseled concerning cessation of tobacco products. I spent over 3 minutes for discussion and to answer questions.  2. Discharging abscess of vagina Patient has noted purulence from the vaginal canal over the past several weeks consistent with presence of an abscess.  Suspect this is due to a fistula that has developed between this and the perirectal abscess that she has a history thereof.  Will culture the  purulence and sent for sensitivity.  Will check CBC.  And will refer for general surgery to take a look for evaluation of surgery and whether or not this needs to be drained percutaneously or via direct. - Ambulatory referral to General Surgery - Anaerobic and Aerobic Culture  3. Perirectal abscess Patient upon review of her GYN notes it was noted that she has a perirectal abscess history.  She has had earlier constipation and purulence from the rectal area this is decreased but on rectal exams it was noted to have a foul odor and consistent with drainage of a abscess per rectal.  Will refer to general surgery for evaluation and possible treatment. - Ambulatory referral to General Surgery  4. Colon cancer screening Guaiac was noted to be negative - POCT  occult blood stool  5. Urinary tract infection with pyuria Patient does have red blood cells and white cells but I suspect this being be more contaminant because patient is asymptomatic as far as a UTI.  Patient has been to the sister pharmacy to obtain her medications most recently she has self medicated with Septra x1 Cipro x2 cycles. - POCT Urinalysis Dipstick

## 2018-11-29 ENCOUNTER — Ambulatory Visit: Payer: BC Managed Care – PPO | Admitting: General Surgery

## 2018-11-29 ENCOUNTER — Encounter: Payer: Self-pay | Admitting: General Surgery

## 2018-11-29 ENCOUNTER — Other Ambulatory Visit: Payer: Self-pay

## 2018-11-29 VITALS — BP 154/87 | HR 86 | Temp 97.3°F | Ht 67.0 in | Wt 205.0 lb

## 2018-11-29 DIAGNOSIS — K611 Rectal abscess: Secondary | ICD-10-CM

## 2018-11-29 NOTE — Patient Instructions (Addendum)
The patient is aware to call back for any questions or new concerns. CT scan [place] location 9:00 Thursday December 06 2018

## 2018-11-30 NOTE — Progress Notes (Signed)
Patient ID: Brandy Cummings, female   DOB: 10/17/1955, 63 y.o.   MRN: [id number]  Chief Complaint  Patient presents with   New Patient (Initial Visit)    new pt ref Dr.Deanna Yetta BarreJones Perianal abscess    HPI Brandy Cummings is a 63 y.o. female.   She was referred by her primary care provider, Dr. Elizabeth Sauereanna Jones, for urgent evaluation of a perianal abscess.  Brandy Cummings states that about a year ago, she had an abscess in a similar location but that it went away on its own.  In May of this year, she says that she thought she was constipated and took some laxatives.  She noticed drainage from her rectum.  Approximately 2 to 3 weeks after this event she noticed purulent drainage from her vagina.  She denies any fevers or chills.  No nausea or vomiting.  She does report having regular bowel movements without pain.  Because the prior area had cleared up on its own, she delayed seeking care from her doctor until 5 August.  At that visit, apparently some of the vaginal discharge was sent for culture.  Those results are not available at this time.  Due to concern for potential rectovaginal fistula, Brandy Cummings has been referred to general surgery for further evaluation.   Past Medical History:  Diagnosis Date   Diabetes mellitus without complication (HCC)    Hyperlipidemia    Hypertension     Past Surgical History:  Procedure Laterality Date   ANKLE FRACTURE SURGERY Left    COLONOSCOPY  10/24/2010   cleared for 5 years- [place]     Family History  Problem Relation Age of Onset   Diabetes Mother    Cancer Father    Heart disease Father    Cancer Maternal Grandfather    Cancer Paternal Grandmother     Social History Social History   Tobacco Use   Smoking status: Current Every Day Smoker    Packs/day: 1.00    Years: 40.00    Pack years: 40.00    Types: Cigarettes   Smokeless tobacco: Never Used  Substance Use Topics   Alcohol use: No    Alcohol/week: 0.0 standard drinks    Drug use: No    No Known Allergies  Current Outpatient Medications  Medication Sig Dispense Refill   metFORMIN (GLUCOPHAGE-XR) 500 MG 24 hr tablet TAKE 1 TABLET BY MOUTH ONCE DAILY WITH BREAKFAST 90 tablet 1   metoprolol tartrate (LOPRESSOR) 50 MG tablet Take 1 tablet (50 mg total) by mouth 2 (two) times daily. 180 tablet 1   No current facility-administered medications for this visit.     Review of Systems Review of Systems  All other systems reviewed and are negative.   Blood pressure (!) 154/87, pulse 86, temperature (!) 97.3 F (36.3 C), temperature source Temporal, height 5\' 7"  (1.702 m), weight 205 lb (93 kg), SpO2 94 %.  Physical Exam Physical Exam Vitals signs reviewed. Exam conducted with a chaperone present.  Constitutional:      General: She is not in acute distress.    Appearance: She is obese.  HENT:     Head: Normocephalic and atraumatic.     Nose:     Comments: Covered with a mask secondary to COVID-19 precautions    Mouth/Throat:     Comments: Covered with a mask secondary to COVID-19 precautions Eyes:     General: No scleral icterus.       Right eye: No discharge.  Left eye: No discharge.  Neck:     Musculoskeletal: Normal range of motion.     Comments: No thyromegaly or thyroid masses appreciated Cardiovascular:     Rate and Rhythm: Normal rate and regular rhythm.     Pulses: Normal pulses.     Heart sounds: Normal heart sounds.  Pulmonary:     Effort: Pulmonary effort is normal.     Breath sounds: Normal breath sounds.  Abdominal:     General: Bowel sounds are normal.     Palpations: Abdomen is soft.     Comments: Protuberant, consistent with her level of obesity.  Genitourinary:    Exam position: Knee-chest position.       Comments: There is no abscess identified on exam.  The patient points to her right buttock in the area where the abscess has been in the past, but there are no findings of fluctuance, erythema, or induration in this  location.  On digital rectal exam, once again, no fluctuance is appreciated.  There are no masses nor bleeding present.  On the right side of the anus, there is an indentation that suggests a possible fistula in this location, or at least a prior fistula.  I am unable to pass a small Q-tip into this area to identify any sort of tract.  I did not perform a vaginal exam. Musculoskeletal:        General: No swelling.  Lymphadenopathy:     Cervical: No cervical adenopathy.  Skin:    General: Skin is warm and dry.  Neurological:     General: No focal deficit present.     Mental Status: She is alert.  Psychiatric:        Mood and Affect: Mood normal.        Behavior: Behavior normal.     Data Reviewed I reviewed Dr. Adah Salvage notes from the electronic medical record, including her most recent note from 5 August.  This does state that Brandy Cummings has been encouraged on multiple occasions to present to the emergency department or evaluation by general surgery secondary to multiple abscesses.  Assessment This is a 63 year old woman who gives a history of perianal and gluteal abscesses.  No abscess is present on examination today.  According to her primary care provider, as well as the patient, she has had purulent discharge from her vagina.  Cultures are currently pending.  No imaging has been obtained.  Plan We will obtain a CT scan of the abdomen and pelvis with contrast to evaluate for abscess or possible fistula.  If she does not, indeed, have a rectovaginal fistula, this will need to be addressed by a fellowship trained colon and rectal surgeon and will require referral to such for management.  We will await the results of her imaging and discussed the results and plan with her further once those data are available.    Fredirick Maudlin 11/30/2018, 12:17 PM

## 2018-12-03 ENCOUNTER — Other Ambulatory Visit: Payer: Self-pay

## 2018-12-03 DIAGNOSIS — K611 Rectal abscess: Secondary | ICD-10-CM

## 2018-12-03 LAB — ANAEROBIC AND AEROBIC CULTURE

## 2018-12-03 MED ORDER — DOXYCYCLINE HYCLATE 100 MG PO TABS: 100.0000 mg | ORAL_TABLET | Freq: Two times a day (BID) | ORAL | 0 refills | Status: DC

## 2018-12-03 NOTE — Progress Notes (Unsigned)
Sent in doxy d/t culture results

## 2018-12-06 ENCOUNTER — Other Ambulatory Visit: Payer: Self-pay

## 2018-12-06 ENCOUNTER — Telehealth: Payer: Self-pay | Admitting: General Surgery

## 2018-12-06 ENCOUNTER — Ambulatory Visit
Admission: RE | Admit: 2018-12-06 | Discharge: 2018-12-06 | Disposition: A | Discharge status: Home / Self Care | Payer: BC Managed Care – PPO | Source: Ambulatory Visit | Attending: General Surgery | Admitting: General Surgery

## 2018-12-06 ENCOUNTER — Telehealth: Payer: Self-pay | Admitting: *Deleted

## 2018-12-06 DIAGNOSIS — N824 Other female intestinal-genital tract fistulae: Secondary | ICD-10-CM

## 2018-12-06 DIAGNOSIS — K611 Rectal abscess: Secondary | ICD-10-CM

## 2018-12-06 MED ORDER — IOPAMIDOL (ISOVUE-300) INJECTION 61%
50.0000 mL | Freq: Once | INTRAVENOUS | Status: AC | PRN
  Administered 2018-12-06: 100 mL via INTRAVENOUS

## 2018-12-06 NOTE — Telephone Encounter (Signed)
Called to discuss CT scan results. No answer. LM on VM for her to return my call.

## 2018-12-06 NOTE — Telephone Encounter (Signed)
Please place a referral to colon and rectal surgery per Dr Celine Ahr, [facility] Dr Audie Clear pt preference. She has a colovaginal fistula. Pt aware they will call her with an appointment.

## 2018-12-12 ENCOUNTER — Encounter: Payer: Self-pay | Admitting: *Deleted

## 2018-12-12 NOTE — Progress Notes (Signed)
Per Rodena Piety with [facility] (Phone: [phone]), they need the images for patient's CT abdomen/pelvis done 12-06-18.   I have called and spoke with Pam at the radiology department at [facility] and have asked that they push these thru powershare for [place].

## 2018-12-22 ENCOUNTER — Other Ambulatory Visit: Payer: Self-pay | Admitting: Family Medicine

## 2018-12-22 DIAGNOSIS — E119 Type 2 diabetes mellitus without complications: Secondary | ICD-10-CM

## 2019-01-01 ENCOUNTER — Other Ambulatory Visit: Payer: Self-pay

## 2019-01-01 ENCOUNTER — Ambulatory Visit: Payer: BC Managed Care – PPO | Admitting: Family Medicine

## 2019-01-01 VITALS — BP 130/78 | HR 72 | Ht 67.0 in | Wt 207.0 lb

## 2019-01-01 DIAGNOSIS — F17210 Nicotine dependence, cigarettes, uncomplicated: Secondary | ICD-10-CM

## 2019-01-01 DIAGNOSIS — Z23 Encounter for immunization: Secondary | ICD-10-CM

## 2019-01-01 DIAGNOSIS — E782 Mixed hyperlipidemia: Secondary | ICD-10-CM

## 2019-01-01 DIAGNOSIS — E119 Type 2 diabetes mellitus without complications: Secondary | ICD-10-CM | POA: Diagnosis not present

## 2019-01-01 DIAGNOSIS — I10 Essential (primary) hypertension: Secondary | ICD-10-CM

## 2019-01-01 MED ORDER — METFORMIN HCL ER 500 MG PO TB24: ORAL_TABLET | ORAL | 1 refills | Status: DC

## 2019-01-01 MED ORDER — METOPROLOL TARTRATE 50 MG PO TABS: 50.0000 mg | ORAL_TABLET | Freq: Two times a day (BID) | ORAL | 1 refills | Status: DC

## 2019-01-01 NOTE — Progress Notes (Signed)
Date:  01/01/2019   Name:  Marguerite Oleaancy Portales   DOB:  01/06/1956   MRN:  [id number]   Chief Complaint: Diabetes, Hypertension, and influenza vacc need  Diabetes She presents for her follow-up diabetic visit. Diabetes type: prediabetes. Her disease course has been stable. There are no hypoglycemic associated symptoms. Pertinent negatives for hypoglycemia include no dizziness, headaches, nervousness/anxiousness or sweats. Pertinent negatives for diabetes include no blurred vision, no chest pain, no fatigue, no foot paresthesias, no foot ulcerations, no polydipsia, no polyphagia, no polyuria, no visual change, no weakness and no weight loss. There are no hypoglycemic complications. Symptoms are stable. There are no diabetic complications. Pertinent negatives for diabetic complications include no CVA, PVD or retinopathy. Risk factors for coronary artery disease include hypertension, dyslipidemia, obesity and post-menopausal. Current diabetic treatment includes diet. She is compliant with treatment most of the time. She is following a generally healthy diet. Meal planning includes avoidance of concentrated sweets and carbohydrate counting. She participates in exercise intermittently. There is no change in her home blood glucose trend. An ACE inhibitor/angiotensin II receptor blocker is not being taken.  Hypertension This is a chronic problem. The current episode started more than 1 year ago. The problem has been gradually improving since onset. The problem is controlled. Pertinent negatives include no anxiety, blurred vision, chest pain, headaches, malaise/fatigue, neck pain, orthopnea, palpitations, peripheral edema, PND, shortness of breath or sweats. There are no associated agents to hypertension. Risk factors for coronary artery disease include dyslipidemia, obesity and post-menopausal state. Past treatments include beta blockers. The current treatment provides moderate improvement. There are no compliance  problems.  There is no history of angina, kidney disease, CAD/MI, CVA, heart failure, left ventricular hypertrophy, PVD or retinopathy. There is no history of chronic renal disease, a hypertension causing med or renovascular disease.  Hyperlipidemia This is a chronic problem. The current episode started more than 1 year ago. Recent lipid tests were reviewed and are variable. Exacerbating diseases include diabetes and obesity. She has no history of chronic renal disease, hypothyroidism, liver disease or nephrotic syndrome. Pertinent negatives include no chest pain, myalgias or shortness of breath. Current antihyperlipidemic treatment includes statins. The current treatment provides mild improvement of lipids. There are no compliance problems.     Review of Systems  Constitutional: Negative.  Negative for chills, fatigue, fever, malaise/fatigue, unexpected weight change and weight loss.  HENT: Negative for congestion, ear discharge, ear pain, rhinorrhea, sinus pressure, sneezing and sore throat.   Eyes: Negative for blurred vision, photophobia, pain, discharge, redness and itching.  Respiratory: Negative for cough, shortness of breath, wheezing and stridor.   Cardiovascular: Negative for chest pain, palpitations, orthopnea and PND.  Gastrointestinal: Negative for abdominal pain, blood in stool, constipation, diarrhea, nausea and vomiting.  Endocrine: Negative for cold intolerance, heat intolerance, polydipsia, polyphagia and polyuria.  Genitourinary: Negative for dysuria, flank pain, frequency, hematuria, menstrual problem, pelvic pain, urgency, vaginal bleeding and vaginal discharge.  Musculoskeletal: Negative for arthralgias, back pain, myalgias and neck pain.  Skin: Negative for rash.  Allergic/Immunologic: Negative for environmental allergies and food allergies.  Neurological: Negative for dizziness, weakness, light-headedness, numbness and headaches.  Hematological: Negative for adenopathy. Does  not bruise/bleed easily.  Psychiatric/Behavioral: Negative for dysphoric mood. The patient is not nervous/anxious.     Patient Active Problem List   Diagnosis Date Noted  . Recurrent major depressive disorder, in partial remission (HCC) 08/02/2016  . Moderate episode of recurrent major depressive disorder (HCC) 08/02/2016  . Essential hypertension  01/26/2015  . Hyperlipidemia 01/26/2015  . Type 2 diabetes mellitus without complication, without long-term current use of insulin (HCC) 01/26/2015    No Known Allergies  Past Surgical History:  Procedure Laterality Date  . ANKLE FRACTURE SURGERY Left   . COLONOSCOPY  10/24/2010   cleared for 5 years- [place]     Social History   Tobacco Use  . Smoking status: Current Every Day Smoker    Packs/day: 1.00    Years: 40.00    Pack years: 40.00    Types: Cigarettes  . Smokeless tobacco: Never Used  Substance Use Topics  . Alcohol use: No    Alcohol/week: 0.0 standard drinks  . Drug use: No     Medication list has been reviewed and updated.  Current Meds  Medication Sig  . metFORMIN (GLUCOPHAGE-XR) 500 MG 24 hr tablet Take 1 tablet by mouth once daily with breakfast  . metoprolol tartrate (LOPRESSOR) 50 MG tablet Take 1 tablet (50 mg total) by mouth 2 (two) times daily.    PHQ 2/9 Scores 01/01/2019 02/15/2018 07/27/2015 01/26/2015  PHQ - 2 Score 0 2 0 1  PHQ- 9 Score 0 8 - -    BP Readings from Last 3 Encounters:  01/01/19 130/78  11/29/18 (!) 154/87  11/28/18 138/80    Physical Exam Vitals signs and nursing note reviewed.  Constitutional:      Appearance: She is well-developed. She is obese.  HENT:     Head: Normocephalic.     Right Ear: Tympanic membrane, ear canal and external ear normal.     Left Ear: Tympanic membrane, ear canal and external ear normal.     Nose: Nose normal. No congestion.     Mouth/Throat:     Mouth: Mucous membranes are moist.  Eyes:     General: Lids are everted, no foreign bodies  appreciated. No scleral icterus.       Left eye: No foreign body or hordeolum.     Conjunctiva/sclera: Conjunctivae normal.     Right eye: Right conjunctiva is not injected.     Left eye: Left conjunctiva is not injected.     Pupils: Pupils are equal, round, and reactive to light.  Neck:     Musculoskeletal: Normal range of motion and neck supple.     Thyroid: No thyromegaly.     Vascular: No JVD.     Trachea: No tracheal deviation.  Cardiovascular:     Rate and Rhythm: Normal rate and regular rhythm.     Pulses: Normal pulses.     Heart sounds: Normal heart sounds. No murmur. No friction rub. No gallop.   Pulmonary:     Effort: Pulmonary effort is normal. No respiratory distress.     Breath sounds: Normal breath sounds. No wheezing, rhonchi or rales.  Abdominal:     General: Bowel sounds are normal.     Palpations: Abdomen is soft. There is no mass.     Tenderness: There is no abdominal tenderness. There is no guarding or rebound.  Musculoskeletal: Normal range of motion.        General: No tenderness.  Lymphadenopathy:     Cervical: No cervical adenopathy.  Skin:    General: Skin is warm.     Findings: No rash.  Neurological:     Mental Status: She is alert and oriented to person, place, and time.     Cranial Nerves: No cranial nerve deficit.     Deep Tendon Reflexes: Reflexes normal.  Psychiatric:  Mood and Affect: Mood is not anxious or depressed.     Wt Readings from Last 3 Encounters:  01/01/19 207 lb (93.9 kg)  11/29/18 205 lb (93 kg)  11/28/18 205 lb (93 kg)    BP 130/78   Pulse 72   Ht 5\' 7"  (1.702 m)   Wt 207 lb (93.9 kg)   BMI 32.42 kg/m   Assessment and Plan: 1. Essential hypertension Chronic.  Controlled.  Will continue meds metoprolol 50 mg twice a day.  Reviewed renal panel from August which is in normal limits. - metoprolol tartrate (LOPRESSOR) 50 MG tablet; Take 1 tablet (50 mg total) by mouth 2 (two) times daily.  Dispense: 180 tablet;  Refill: 1  2. Type 2 diabetes mellitus without complication, without long-term current use of insulin ([place]) More for prediabetic condition.  This is chronic.  Controlled.  Continue Metformin XR 500 mg once a day.  Will check A1c and microalbuminuria. - HgB A1c - Microalbumin, urine - metFORMIN (GLUCOPHAGE-XR) 500 MG 24 hr tablet; One a day  Dispense: 90 tablet; Refill: 1  3. Mixed hyperlipidemia Patient was noted to have elevated triglycerides on the last testing.  Uncertain about the fasting nature of the specimen.  Will repeat today with lipid panel.  If glycerides were over 200 we will likely resume gemfibrozil. - Lipid Panel With LDL/HDL Ratio  4. Cigarette nicotine dependence without complication Patient has been advised of the health risks of smoking and counseled concerning cessation of tobacco products. I spent over 3 minutes for discussion and to answer questions.  5. Influenza vaccine needed Discussed and administered.  6. Obesity Health risks of being over weight were discussed and patient was counseled on weight loss options and exercise.

## 2019-01-01 NOTE — Addendum Note (Signed)
Addended by: Fredderick Severance on: 01/01/2019 10:11 AM   Modules accepted: Orders

## 2019-01-01 NOTE — Patient Instructions (Signed)

## 2019-01-02 LAB — LIPID PANEL WITH LDL/HDL RATIO
Cholesterol, Total: 199 mg/dL (ref 100–199)
HDL: 36 mg/dL — ABNORMAL LOW (ref >39)
LDL Chol Calc (NIH): 106 mg/dL — ABNORMAL HIGH (ref 0–99)
LDL/HDL Ratio: 2.9 ratio (ref 0.0–3.2)
Triglycerides: 330 mg/dL — ABNORMAL HIGH (ref 0–149)
VLDL Cholesterol Cal: 57 mg/dL — ABNORMAL HIGH (ref 5–40)

## 2019-01-02 LAB — HEMOGLOBIN A1C
Est. average glucose Bld gHb Est-mCnc: 103 mg/dL
Hgb A1c MFr Bld: 5.2 % (ref 4.8–5.6)

## 2019-01-02 LAB — MICROALBUMIN, URINE: Microalbumin, Urine: 215.5 ug/mL

## 2019-01-03 ENCOUNTER — Other Ambulatory Visit: Payer: Self-pay

## 2019-01-03 DIAGNOSIS — E782 Mixed hyperlipidemia: Secondary | ICD-10-CM

## 2019-01-03 MED ORDER — GEMFIBROZIL 600 MG PO TABS: 600.0000 mg | ORAL_TABLET | Freq: Every day | ORAL | 1 refills | Status: DC

## 2019-01-03 NOTE — Progress Notes (Unsigned)
Sent in lopid 

## 2019-03-04 ENCOUNTER — Other Ambulatory Visit: Payer: Self-pay | Admitting: Family Medicine

## 2019-03-04 DIAGNOSIS — E782 Mixed hyperlipidemia: Secondary | ICD-10-CM

## 2019-04-29 ENCOUNTER — Other Ambulatory Visit: Payer: Self-pay | Admitting: Family Medicine

## 2019-04-29 DIAGNOSIS — E782 Mixed hyperlipidemia: Secondary | ICD-10-CM

## 2019-07-02 ENCOUNTER — Ambulatory Visit: Payer: BC Managed Care – PPO | Admitting: Family Medicine

## 2019-07-02 ENCOUNTER — Encounter: Payer: Self-pay | Admitting: Family Medicine

## 2019-07-02 ENCOUNTER — Other Ambulatory Visit: Payer: Self-pay

## 2019-07-02 VITALS — BP 130/70 | HR 80 | Ht 67.0 in | Wt 213.0 lb

## 2019-07-02 DIAGNOSIS — E119 Type 2 diabetes mellitus without complications: Secondary | ICD-10-CM | POA: Diagnosis not present

## 2019-07-02 DIAGNOSIS — I10 Essential (primary) hypertension: Secondary | ICD-10-CM | POA: Diagnosis not present

## 2019-07-02 DIAGNOSIS — F3341 Major depressive disorder, recurrent, in partial remission: Secondary | ICD-10-CM

## 2019-07-02 DIAGNOSIS — E782 Mixed hyperlipidemia: Secondary | ICD-10-CM | POA: Diagnosis not present

## 2019-07-02 DIAGNOSIS — Z6837 Body mass index (BMI) 37.0-37.9, adult: Secondary | ICD-10-CM

## 2019-07-02 MED ORDER — METOPROLOL TARTRATE 50 MG PO TABS: 50.0000 mg | ORAL_TABLET | Freq: Two times a day (BID) | ORAL | 1 refills | Status: DC

## 2019-07-02 MED ORDER — GEMFIBROZIL 600 MG PO TABS: 600.0000 mg | ORAL_TABLET | Freq: Every day | ORAL | 1 refills | Status: DC

## 2019-07-02 MED ORDER — METFORMIN HCL ER 500 MG PO TB24: ORAL_TABLET | ORAL | 1 refills | Status: DC

## 2019-07-02 NOTE — Progress Notes (Addendum)
Date:  07/02/2019   Name:  Brandy Cummings   DOB:  Sep 17, 1955   MRN:  [id number]   Chief Complaint: Hypertension, Hyperlipidemia, and Diabetes (doesn't check at home)  Hypertension This is a chronic problem. The current episode started more than 1 year ago. The problem has been waxing and waning since onset. The problem is controlled. Pertinent negatives include no anxiety, blurred vision, chest pain, headaches, malaise/fatigue, neck pain, orthopnea, palpitations, peripheral edema, PND, shortness of breath or sweats. There are no associated agents to hypertension. Risk factors for coronary artery disease include diabetes mellitus and dyslipidemia. Past treatments include beta blockers. The current treatment provides moderate improvement. There are no compliance problems.  There is no history of angina, kidney disease, CAD/MI, CVA, heart failure, left ventricular hypertrophy, PVD or retinopathy. There is no history of chronic renal disease, a hypertension causing med or renovascular disease.  Hyperlipidemia This is a chronic problem. The current episode started more than 1 year ago. The problem is controlled. Recent lipid tests were reviewed and are normal. Exacerbating diseases include diabetes and obesity. She has no history of chronic renal disease, hypothyroidism, liver disease or nephrotic syndrome. Pertinent negatives include no chest pain, focal sensory loss, focal weakness, leg pain, myalgias or shortness of breath. Current antihyperlipidemic treatment includes statins. The current treatment provides moderate improvement of lipids. There are no compliance problems.  Risk factors for coronary artery disease include diabetes mellitus, dyslipidemia and hypertension.  Diabetes She presents for her follow-up diabetic visit. She has type 2 diabetes mellitus. Her disease course has been stable. Pertinent negatives for hypoglycemia include no confusion, dizziness, headaches, hunger, mood changes,  nervousness/anxiousness, pallor, seizures, sleepiness, speech difficulty, sweats or tremors. Pertinent negatives for diabetes include no blurred vision, no chest pain, no fatigue, no polydipsia, no polyphagia, no polyuria and no weakness. There are no hypoglycemic complications. Symptoms are stable. There are no diabetic complications. Pertinent negatives for diabetic complications include no CVA, PVD or retinopathy. Risk factors for coronary artery disease include dyslipidemia, diabetes mellitus and hypertension. Current diabetic treatment includes oral agent (monotherapy). She is following a generally healthy diet.    Lab Results  Component Value Date   CREATININE 0.50 11/28/2018   BUN 5 (L) 11/28/2018   NA 137 11/28/2018   K 4.1 11/28/2018   CL 105 11/28/2018   CO2 22 11/28/2018   Lab Results  Component Value Date   CHOL 199 01/01/2019   HDL 36 (L) 01/01/2019   LDLCALC 106 (H) 01/01/2019   TRIG 330 (H) 01/01/2019   CHOLHDL 5.9 (H) 05/31/2018   No results found for: TSH Lab Results  Component Value Date   HGBA1C 5.2 01/01/2019     Review of Systems  Constitutional: Negative.  Negative for chills, fatigue, fever, malaise/fatigue and unexpected weight change.  HENT: Negative for congestion, ear discharge, ear pain, postnasal drip, rhinorrhea, sinus pressure, sneezing and sore throat.   Eyes: Negative for blurred vision, photophobia, pain, discharge, redness and itching.  Respiratory: Negative for cough, shortness of breath, wheezing and stridor.   Cardiovascular: Negative for chest pain, palpitations, orthopnea and PND.  Gastrointestinal: Negative for abdominal pain, blood in stool, constipation, diarrhea, nausea and vomiting.  Endocrine: Negative for cold intolerance, heat intolerance, polydipsia, polyphagia and polyuria.  Genitourinary: Negative for dysuria, flank pain, frequency, hematuria, menstrual problem, pelvic pain, urgency, vaginal bleeding and vaginal discharge.    Musculoskeletal: Negative for arthralgias, back pain, myalgias and neck pain.  Skin: Negative for pallor and rash.  Allergic/Immunologic: Negative for environmental allergies and food allergies.  Neurological: Negative for dizziness, tremors, focal weakness, seizures, speech difficulty, weakness, light-headedness, numbness and headaches.  Hematological: Negative for adenopathy. Does not bruise/bleed easily.  Psychiatric/Behavioral: Negative for confusion and dysphoric mood. The patient is not nervous/anxious.     Patient Active Problem List   Diagnosis Date Noted  . Recurrent major depressive disorder, in partial remission ([place]) 08/02/2016  . Moderate episode of recurrent major depressive disorder ([place]) 08/02/2016  . Essential hypertension 01/26/2015  . Hyperlipidemia 01/26/2015  . Type 2 diabetes mellitus without complication, without long-term current use of insulin ([place]) 01/26/2015    No Known Allergies  Past Surgical History:  Procedure Laterality Date  . ANKLE FRACTURE SURGERY Left   . COLONOSCOPY  10/24/2010   cleared for 5 years- [place]     Social History   Tobacco Use  . Smoking status: Current Every Day Smoker    Packs/day: 1.00    Years: 40.00    Pack years: 40.00    Types: Cigarettes  . Smokeless tobacco: Never Used  Substance Use Topics  . Alcohol use: No    Alcohol/week: 0.0 standard drinks  . Drug use: No     Medication list has been reviewed and updated.  Current Meds  Medication Sig  . gemfibrozil (LOPID) 600 MG tablet Take 1 tablet by mouth once daily  . metFORMIN (GLUCOPHAGE-XR) 500 MG 24 hr tablet One a day  . metoprolol tartrate (LOPRESSOR) 50 MG tablet Take 1 tablet (50 mg total) by mouth 2 (two) times daily.    PHQ 2/9 Scores 07/02/2019 01/01/2019 02/15/2018 07/27/2015  PHQ - 2 Score 1 0 2 0  PHQ- 9 Score 2 0 8 -    BP Readings from Last 3 Encounters:  07/02/19 130/70  01/01/19 130/78  11/29/18 (!) 154/87    Physical Exam Vitals and  nursing note reviewed.  Constitutional:      Appearance: She is well-developed.  HENT:     Head: Normocephalic.     Right Ear: Tympanic membrane, ear canal and external ear normal.     Left Ear: Tympanic membrane, ear canal and external ear normal.     Nose: Nose normal.     Mouth/Throat:     Mouth: Mucous membranes are moist.  Eyes:     General: Lids are everted, no foreign bodies appreciated. No scleral icterus.       Left eye: No foreign body or hordeolum.     Conjunctiva/sclera: Conjunctivae normal.     Right eye: Right conjunctiva is not injected.     Left eye: Left conjunctiva is not injected.     Pupils: Pupils are equal, round, and reactive to light.  Neck:     Thyroid: No thyromegaly.     Vascular: No JVD.     Trachea: No tracheal deviation.  Cardiovascular:     Rate and Rhythm: Normal rate and regular rhythm.     Pulses: Normal pulses.          Dorsalis pedis pulses are 2+ on the right side and 2+ on the left side.       Posterior tibial pulses are 2+ on the right side and 2+ on the left side.     Heart sounds: Normal heart sounds. No murmur. No friction rub. No gallop.   Pulmonary:     Effort: Pulmonary effort is normal. No respiratory distress.     Breath sounds: Normal breath sounds. No wheezing, rhonchi or rales.  Abdominal:     General: Bowel sounds are normal.     Palpations: Abdomen is soft. There is no mass.     Tenderness: There is no abdominal tenderness. There is no guarding or rebound.  Musculoskeletal:        General: No tenderness. Normal range of motion.     Cervical back: Normal range of motion and neck supple.     Right lower leg: No edema.     Left lower leg: No edema.  Feet:     Right foot:     Protective Sensation: 10 sites tested. 10 sites sensed.     Skin integrity: Skin integrity normal. No ulcer, blister, skin breakdown, erythema, warmth, callus, dry skin or fissure.     Toenail Condition: Right toenails are normal.     Left foot:      Protective Sensation: 10 sites tested. 10 sites sensed.     Skin integrity: Skin integrity normal. No ulcer, blister, skin breakdown, erythema, warmth, callus, dry skin or fissure.     Toenail Condition: Left toenails are normal.  Lymphadenopathy:     Cervical: No cervical adenopathy.  Skin:    General: Skin is warm.     Findings: No rash.  Neurological:     Mental Status: She is alert and oriented to person, place, and time.     Cranial Nerves: No cranial nerve deficit.     Deep Tendon Reflexes: Reflexes normal.  Psychiatric:        Mood and Affect: Mood is not anxious or depressed.     Wt Readings from Last 3 Encounters:  07/02/19 213 lb (96.6 kg)  01/01/19 207 lb (93.9 kg)  11/29/18 205 lb (93 kg)    BP 130/70   Pulse 80   Ht 5\' 7"  (1.702 m)   Wt 213 lb (96.6 kg)   BMI 33.36 kg/m   Assessment and Plan:  1. Type 2 diabetes mellitus without complication, without long-term current use of insulin (HCC) Chronic.  Controlled.  Stable.  Review of last A1c's notes to be 5.2-6.2.  We will recheck comprehensive metabolic panel and will continue Metformin XR 500 mg once a day. - Lipid Panel With LDL/HDL Ratio - HgB A1c - Comprehensive Metabolic Panel (CMET) - metFORMIN (GLUCOPHAGE-XR) 500 MG 24 hr tablet; One a day  Dispense: 90 tablet; Refill: 1  2. Mixed hyperlipidemia Chronic.  Controlled.  Stable.  LDL was elevated and therefore gemfibrozil 600 mg 1 a day was restarted if LDL is elevated patient has been informed that we may be starting a statin. - gemfibrozil (LOPID) 600 MG tablet; Take 1 tablet (600 mg total) by mouth daily.  Dispense: 90 tablet; Refill: 1  3. Essential hypertension Chronic.  Controlled.  Stable.  Blood pressure 130/70 and is stable at present.  There was an elevated reading at home with patient had some concerns about her condition but is been in a normal range from previous visits.  Will continue metoprolol 50 mg twice a day. - Comprehensive Metabolic  Panel (CMET) - metoprolol tartrate (LOPRESSOR) 50 MG tablet; Take 1 tablet (50 mg total) by mouth 2 (two) times daily.  Dispense: 180 tablet; Refill: 1  4. Recurrent major depressive disorder, in partial remission (HCC) Chronic.  Controlled.  Stable.  PHQ score 2 with a gad score of 1 patient is doing well except with some irritation on accusations that she discussed with me.  I do not feel that we need to currently put on  a medication but we will keep an eye on this depending on her disposition.    5. Class 2 severe obesity due to excess calories with serious comorbidity and body mass index (BMI) of 37.0 to 37.9 in adult [facility]) Health risks of being over weight were discussed and patient was counseled on weight loss options and exercise.  It was noted that the hemoglobin was elevated at 16.1.  Patient has been suggested that she stop smoking again and she is going to be giving this some consideration.  I have asked patient to resume a baby aspirin 81 mg once a day.

## 2019-07-02 NOTE — Patient Instructions (Signed)

## 2019-07-03 LAB — CBC WITH DIFFERENTIAL/PLATELET
Basophils Absolute: 0.1 10*3/uL (ref 0.0–0.2)
Basos: 1 %
EOS (ABSOLUTE): 0.2 10*3/uL (ref 0.0–0.4)
Eos: 2 %
Hematocrit: 45.3 % (ref 34.0–46.6)
Hemoglobin: 15.5 g/dL (ref 11.1–15.9)
Immature Grans (Abs): 0 10*3/uL (ref 0.0–0.1)
Immature Granulocytes: 0 %
Lymphocytes Absolute: 2.9 10*3/uL (ref 0.7–3.1)
Lymphs: 31 %
MCH: 35.6 pg — ABNORMAL HIGH (ref 26.6–33.0)
MCHC: 34.2 g/dL (ref 31.5–35.7)
MCV: 104 fL — ABNORMAL HIGH (ref 79–97)
Monocytes Absolute: 0.5 10*3/uL (ref 0.1–0.9)
Monocytes: 5 %
Neutrophils Absolute: 5.7 10*3/uL (ref 1.4–7.0)
Neutrophils: 61 %
Platelets: 176 10*3/uL (ref 150–450)
RBC: 4.35 x10E6/uL (ref 3.77–5.28)
RDW: 11.8 % (ref 11.7–15.4)
WBC: 9.3 10*3/uL (ref 3.4–10.8)

## 2019-07-03 LAB — COMPREHENSIVE METABOLIC PANEL
ALT: 29 IU/L (ref 0–32)
AST: 66 IU/L — ABNORMAL HIGH (ref 0–40)
Albumin/Globulin Ratio: 1.3 (ref 1.2–2.2)
Albumin: 4 g/dL (ref 3.8–4.8)
Alkaline Phosphatase: 89 IU/L (ref 39–117)
BUN/Creatinine Ratio: 11 — ABNORMAL LOW (ref 12–28)
BUN: 8 mg/dL (ref 8–27)
Bilirubin Total: 0.4 mg/dL (ref 0.0–1.2)
CO2: 22 mmol/L (ref 20–29)
Calcium: 9 mg/dL (ref 8.7–10.3)
Chloride: 104 mmol/L (ref 96–106)
Creatinine, Ser: 0.74 mg/dL (ref 0.57–1.00)
GFR calc Af Amer: 100 mL/min/{1.73_m2} (ref >59)
GFR calc non Af Amer: 87 mL/min/{1.73_m2} (ref >59)
Globulin, Total: 3 g/dL (ref 1.5–4.5)
Glucose: 106 mg/dL — ABNORMAL HIGH (ref 65–99)
Potassium: 4.1 mmol/L (ref 3.5–5.2)
Sodium: 141 mmol/L (ref 134–144)
Total Protein: 7 g/dL (ref 6.0–8.5)

## 2019-07-03 LAB — HEMOGLOBIN A1C
Est. average glucose Bld gHb Est-mCnc: 120 mg/dL
Hgb A1c MFr Bld: 5.8 % — ABNORMAL HIGH (ref 4.8–5.6)

## 2019-07-03 LAB — LIPID PANEL WITH LDL/HDL RATIO
Cholesterol, Total: 191 mg/dL (ref 100–199)
HDL: 40 mg/dL (ref >39)
LDL Chol Calc (NIH): 130 mg/dL — ABNORMAL HIGH (ref 0–99)
LDL/HDL Ratio: 3.3 ratio — ABNORMAL HIGH (ref 0.0–3.2)
Triglycerides: 115 mg/dL (ref 0–149)
VLDL Cholesterol Cal: 21 mg/dL (ref 5–40)

## 2019-07-19 ENCOUNTER — Ambulatory Visit: Payer: BC Managed Care – PPO | Attending: Internal Medicine

## 2019-07-19 DIAGNOSIS — Z23 Encounter for immunization: Secondary | ICD-10-CM

## 2019-07-19 NOTE — Progress Notes (Signed)
   Covid-19 Vaccination Clinic  Name:  Brandy Cummings    MRN: [id number] DOB: [birth year]  07/19/2019  [name] was observed post Covid-19 immunization for 15 minutes without incident. She was provided with Vaccine Information Sheet and instruction to access the V-Safe system.   [name] was instructed to call 911 with any severe reactions post vaccine: Marland Kitchen Difficulty breathing  . Swelling of face and throat  . A fast heartbeat  . A bad rash all over body  . Dizziness and weakness   Immunizations Administered    Name Date Dose VIS Date Route   Pfizer COVID-19 Vaccine 07/19/2019  1:08 PM 0.3 mL 04/05/2019 Intramuscular   Manufacturer: [employer], [employer]   Lot: [id number]   NDC: [address]-4

## 2019-08-19 ENCOUNTER — Ambulatory Visit: Payer: BC Managed Care – PPO | Attending: Internal Medicine

## 2019-08-19 DIAGNOSIS — Z23 Encounter for immunization: Secondary | ICD-10-CM

## 2019-08-19 NOTE — Progress Notes (Signed)
   Covid-19 Vaccination Clinic  Name:  Brandy Cummings    MRN: [id number] DOB: [birth year]  08/19/2019  Brandy Cummings was observed post Covid-19 immunization for 15 minutes without incident. She was provided with Vaccine Information Sheet and instruction to access the V-Safe system.   Brandy Cummings was instructed to call 911 with any severe reactions post vaccine: Marland Kitchen Difficulty breathing  . Swelling of face and throat  . A fast heartbeat  . A bad rash all over body  . Dizziness and weakness   Immunizations Administered    Name Date Dose VIS Date Route   Pfizer COVID-19 Vaccine 08/19/2019  1:14 PM 0.3 mL 06/19/2018 Intramuscular   Manufacturer: [employer], [employer]   Lot: [id number]   NDC: [address]-3

## 2019-11-07 ENCOUNTER — Ambulatory Visit: Payer: BC Managed Care – PPO | Admitting: Family Medicine

## 2019-12-25 LAB — HM DIABETES EYE EXAM

## 2020-01-01 ENCOUNTER — Ambulatory Visit (INDEPENDENT_AMBULATORY_CARE_PROVIDER_SITE_OTHER): Payer: BC Managed Care – PPO | Admitting: Family Medicine

## 2020-01-01 ENCOUNTER — Other Ambulatory Visit: Payer: Self-pay

## 2020-01-01 ENCOUNTER — Encounter: Payer: Self-pay | Admitting: Family Medicine

## 2020-01-01 VITALS — BP 132/80 | HR 88 | Ht 67.0 in | Wt 197.0 lb

## 2020-01-01 DIAGNOSIS — E782 Mixed hyperlipidemia: Secondary | ICD-10-CM

## 2020-01-01 DIAGNOSIS — I10 Essential (primary) hypertension: Secondary | ICD-10-CM | POA: Diagnosis not present

## 2020-01-01 DIAGNOSIS — E119 Type 2 diabetes mellitus without complications: Secondary | ICD-10-CM | POA: Diagnosis not present

## 2020-01-01 DIAGNOSIS — Z87898 Personal history of other specified conditions: Secondary | ICD-10-CM

## 2020-01-01 DIAGNOSIS — Z23 Encounter for immunization: Secondary | ICD-10-CM | POA: Diagnosis not present

## 2020-01-01 MED ORDER — METFORMIN HCL ER 500 MG PO TB24: ORAL_TABLET | ORAL | 1 refills | Status: DC

## 2020-01-01 MED ORDER — GEMFIBROZIL 600 MG PO TABS: 600.0000 mg | ORAL_TABLET | Freq: Every day | ORAL | 1 refills | Status: DC

## 2020-01-01 MED ORDER — METOPROLOL TARTRATE 50 MG PO TABS: 50.0000 mg | ORAL_TABLET | Freq: Two times a day (BID) | ORAL | 1 refills | Status: DC

## 2020-01-01 NOTE — Progress Notes (Addendum)
Date:  01/01/2020   Name:  Brandy Cummings   DOB:  [birth year]   MRN:  [id number]   Chief Complaint: Diabetes (foot exam), Flu Vaccine, Hyperlipidemia, and Hypertension  Diabetes She presents for her follow-up diabetic visit. She has type 2 diabetes mellitus. Her disease course has been stable. Hypoglycemia symptoms include headaches. Pertinent negatives for hypoglycemia include no dizziness, nervousness/anxiousness, speech difficulty or sweats. There are no diabetic associated symptoms. Pertinent negatives for diabetes include no blurred vision, no chest pain, no fatigue, no polydipsia, no polyphagia, no polyuria, no visual change and no weakness. There are no hypoglycemic complications. Symptoms are stable. There are no diabetic complications. Risk factors for coronary artery disease include diabetes mellitus, dyslipidemia and hypertension. Current diabetic treatment includes oral agent (monotherapy). Home blood sugar record trend: not checking. An ACE inhibitor/angiotensin II receptor blocker is not being taken. Eye exam is current.  Hyperlipidemia This is a chronic problem. The current episode started more than 1 year ago. The problem is controlled. Recent lipid tests were reviewed and are normal. Exacerbating diseases include diabetes and liver disease. She has no history of chronic renal disease, hypothyroidism, obesity or nephrotic syndrome. Pertinent negatives include no chest pain, focal sensory loss, focal weakness, myalgias or shortness of breath. Current antihyperlipidemic treatment includes statins. The current treatment provides moderate improvement of lipids. Risk factors for coronary artery disease include diabetes mellitus, dyslipidemia and hypertension.  Hypertension This is a chronic problem. The current episode started more than 1 year ago. The problem has been gradually improving since onset. Associated symptoms include headaches. Pertinent negatives include no anxiety, blurred vision,  chest pain, malaise/fatigue, neck pain, orthopnea, palpitations, peripheral edema, PND, shortness of breath or sweats. Risk factors for coronary artery disease include diabetes mellitus. There is no history of chronic renal disease.  Neurologic Problem The patient's pertinent negatives include no altered mental status, clumsiness, focal sensory loss, focal weakness, loss of balance, memory loss, near-syncope, slurred speech, syncope, visual change or weakness. Primary symptoms comment: head feels funny/no focal sensory/motor sxs. This is a chronic problem. The current episode started more than 1 year ago. Associated symptoms include abdominal pain and headaches. Pertinent negatives include no back pain, chest pain, dizziness, fatigue, fever, light-headedness, nausea, neck pain, palpitations, shortness of breath or vomiting. Her past medical history is significant for liver disease.  Nicotine Dependence Symptoms are negative for fatigue and sore throat. Her urge triggers include company of smokers.    Lab Results  Component Value Date   CREATININE 0.74 07/02/2019   BUN 8 07/02/2019   NA 141 07/02/2019   K 4.1 07/02/2019   CL 104 07/02/2019   CO2 22 07/02/2019   Lab Results  Component Value Date   CHOL 191 07/02/2019   HDL 40 07/02/2019   LDLCALC 130 (H) 07/02/2019   TRIG 115 07/02/2019   CHOLHDL 5.9 (H) 05/31/2018   No results found for: TSH Lab Results  Component Value Date   HGBA1C 5.8 (H) 07/02/2019   Lab Results  Component Value Date   WBC 9.3 07/02/2019   HGB 15.5 07/02/2019   HCT 45.3 07/02/2019   MCV 104 (H) 07/02/2019   PLT 176 07/02/2019   Lab Results  Component Value Date   ALT 29 07/02/2019   AST 66 (H) 07/02/2019   ALKPHOS 89 07/02/2019   BILITOT 0.4 07/02/2019     Review of Systems  Constitutional: Negative.  Negative for chills, fatigue, fever, malaise/fatigue and unexpected weight change.  HENT: Negative  for congestion, ear discharge, ear pain,  rhinorrhea, sinus pressure, sneezing and sore throat.   Eyes: Negative for blurred vision, photophobia, pain, discharge, redness and itching.  Respiratory: Negative for cough, chest tightness, shortness of breath, wheezing and stridor.   Cardiovascular: Negative for chest pain, palpitations, orthopnea, leg swelling, PND and near-syncope.  Gastrointestinal: Positive for abdominal pain and diarrhea. Negative for blood in stool, constipation, nausea and vomiting.  Endocrine: Negative for cold intolerance, heat intolerance, polydipsia, polyphagia and polyuria.  Genitourinary: Negative for dysuria, flank pain, frequency, hematuria, menstrual problem, pelvic pain, urgency, vaginal bleeding and vaginal discharge.       Bladder incontinence  Musculoskeletal: Negative for arthralgias, back pain, myalgias and neck pain.  Skin: Negative for rash.  Allergic/Immunologic: Negative for environmental allergies and food allergies.  Neurological: Positive for headaches. Negative for dizziness, focal weakness, syncope, facial asymmetry, speech difficulty, weakness, light-headedness, numbness and loss of balance.  Hematological: Negative for adenopathy. Does not bruise/bleed easily.  Psychiatric/Behavioral: Negative for dysphoric mood and memory loss. The patient is not nervous/anxious.     Patient Active Problem List   Diagnosis Date Noted  . Class 2 severe obesity due to excess calories with serious comorbidity and body mass index (BMI) of 37.0 to 37.9 in adult (HCC) 07/02/2019  . Recurrent major depressive disorder, in partial remission (HCC) 08/02/2016  . Moderate episode of recurrent major depressive disorder (HCC) 08/02/2016  . Essential hypertension 01/26/2015  . Hyperlipidemia 01/26/2015  . Type 2 diabetes mellitus without complication, without long-term current use of insulin (HCC) 01/26/2015    No Known Allergies  Past Surgical History:  Procedure Laterality Date  . ANKLE FRACTURE SURGERY Left     . COLONOSCOPY  10/24/2010   cleared for 5 years- [place]     Social History   Tobacco Use  . Smoking status: Current Every Day Smoker    Packs/day: 1.00    Years: 40.00    Pack years: 40.00    Types: Cigarettes  . Smokeless tobacco: Never Used  Substance Use Topics  . Alcohol use: No    Alcohol/week: 0.0 standard drinks  . Drug use: No     Medication list has been reviewed and updated.  Current Meds  Medication Sig  . gemfibrozil (LOPID) 600 MG tablet Take 1 tablet (600 mg total) by mouth daily.  . metFORMIN (GLUCOPHAGE-XR) 500 MG 24 hr tablet One a day  . metoprolol tartrate (LOPRESSOR) 50 MG tablet Take 1 tablet (50 mg total) by mouth 2 (two) times daily.    PHQ 2/9 Scores 01/01/2020 07/02/2019 01/01/2019 02/15/2018  PHQ - 2 Score 2 1 0 2  PHQ- 9 Score 5 2 0 8    GAD 7 : Generalized Anxiety Score 07/02/2019  Nervous, Anxious, on Edge 0  Control/stop worrying 0  Worry too much - different things 0  Trouble relaxing 0  Restless 0  Easily annoyed or irritable 1  Afraid - awful might happen 0  Total GAD 7 Score 1  Anxiety Difficulty Not difficult at all    BP Readings from Last 3 Encounters:  01/01/20 132/80  07/02/19 130/70  01/01/19 130/78    Physical Exam Vitals and nursing note reviewed.  Constitutional:      General: She is not in acute distress.    Appearance: She is not diaphoretic.  HENT:     Head: Normocephalic and atraumatic.     Right Ear: External ear normal.     Left Ear: External ear normal.  Nose: Nose normal. No congestion or rhinorrhea.  Eyes:     General:        Right eye: No discharge.        Left eye: No discharge.     Conjunctiva/sclera: Conjunctivae normal.     Pupils: Pupils are equal, round, and reactive to light.  Neck:     Thyroid: No thyromegaly.     Vascular: No JVD.  Cardiovascular:     Rate and Rhythm: Normal rate and regular rhythm.     Heart sounds: Normal heart sounds. No murmur heard.  No friction rub. No gallop.    Pulmonary:     Effort: Pulmonary effort is normal.     Breath sounds: Normal breath sounds. No wheezing or rhonchi.  Abdominal:     General: Bowel sounds are normal.     Palpations: Abdomen is soft. There is no mass.     Tenderness: There is no abdominal tenderness. There is no guarding.  Musculoskeletal:        General: Normal range of motion.     Cervical back: Normal range of motion and neck supple.  Lymphadenopathy:     Cervical: No cervical adenopathy.  Skin:    General: Skin is warm and dry.  Neurological:     Mental Status: She is alert.     Deep Tendon Reflexes: Reflexes are normal and symmetric.     Wt Readings from Last 3 Encounters:  01/01/20 197 lb (89.4 kg)  07/02/19 213 lb (96.6 kg)  01/01/19 207 lb (93.9 kg)    BP 132/80   Pulse 88   Ht 5\' 7"  (1.702 m)   Wt 197 lb (89.4 kg)   BMI 30.85 kg/m   Assessment and Plan:                                            Patient presents today for refills of her medications: 1. Essential hypertension Chronic.  Controlled.  Stable.  Continue metoprolol 50 mg twice a day.  Will check renal function panel. - metoprolol tartrate (LOPRESSOR) 50 MG tablet; Take 1 tablet (50 mg total) by mouth 2 (two) times daily.  Dispense: 180 tablet; Refill: 1 - Renal Function Panel  2. Mixed hyperlipidemia Chronic.  Controlled.  Stable.  Continue gemfibrozil 600 mg daily.  Will check lipid panel. - gemfibrozil (LOPID) 600 MG tablet; Take 1 tablet (600 mg total) by mouth daily.  Dispense: 90 tablet; Refill: 1 - Lipid Panel With LDL/HDL Ratio  3. Type 2 diabetes mellitus without complication, without long-term current use of insulin (HCC) Chronic.  Controlled.  Stable.  Continue with diet control.  Will check A1c.  We will continue Metformin XR 500 mg once a day.  We will also check microalbuminuria to assess proteinuria. - Microalbumin, urine - HgB A1c - metFORMIN (GLUCOPHAGE-XR) 500 MG 24 hr tablet; One a day  Dispense: 90 tablet;  Refill: 1 - Renal Function Panel - Lipid Panel With LDL/HDL Ratio  4. History of abdominal abscess chronic.  Persistent/recurrent.  Probably currently active.  As noted 1 year ago patient had a perianal abscess that was evaluated by Dr. and Dr. Lady Gary at [facility].  CT documented there with multiple abscesses and patient was aware of this. Was discussed in detail with patient about her previous encounter with general surgery at [facility] and locally for multiple abscesses that  were noted in the intra-abdominal area.  It was discussed with patient the need for colonoscopy to rule out possibility of colon cancer as well as surgical procedures but she declined at the time and this was noted to the patient.  We will refer back to [facility] for reevaluation seen that patient is starting to have some more discomfort of the abdominal area and is noted no rebound or guarding on exam..  Patient was not amenable to going locally and patient has seen Dr. Clemens Catholic in the past and we will be making a referral in this direction.  Patient is also been told that the best thing with her would be to go to an ER preferably at [facility] or [facility] where they can do a more immediate CT if the pain is of that nature or should it worsen in the near future.  Patient understands and will consider this possibility.  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 - Ambulatory referral to General Surgery  5. Need for immunization against influenza Discussed and administered. - Flu Vaccine QUAD 36+ mos IM  6.  Nicotine dependency:Patient has been advised of the health risks of smoking and counseled concerning cessation of tobacco products. I spent over 3 minutes for discussion and to answer questions. In the past patient has had some suggestions of TIA  but has not had any focal sensory or motor deficiencies noted.  Patient is continuing on low-dose aspirin and this has been encouraged as well.

## 2020-01-01 NOTE — Patient Instructions (Signed)
Coping with Quitting Smoking  Quitting smoking is a physical and mental challenge. You will face cravings, withdrawal symptoms, and temptation. Before quitting, work with your health care provider to make a plan that can help you cope. Preparation can help you quit and keep you from giving in. How can I cope with cravings? Cravings usually last for 5-10 minutes. If you get through it, the craving will pass. Consider taking the following actions to help you cope with cravings:  Keep your mouth busy: ? Chew sugar-free gum. ? Suck on hard candies or a straw. ? Brush your teeth.  Keep your hands and body busy: ? Immediately change to a different activity when you feel a craving. ? Squeeze or play with a ball. ? Do an activity or a hobby, like making bead jewelry, practicing needlepoint, or working with wood. ? Mix up your normal routine. ? Take a short exercise break. Go for a quick walk or run up and down stairs. ? Spend time in public places where smoking is not allowed.  Focus on doing something kind or helpful for someone else.  Call a friend or family member to talk during a craving.  Join a support group.  Call a quit line, such as 1-800-QUIT-NOW.  Talk with your health care provider about medicines that might help you cope with cravings and make quitting easier for you. How can I deal with withdrawal symptoms? Your body may experience negative effects as it tries to get used to not having nicotine in the system. These effects are called withdrawal symptoms. They may include:  Feeling hungrier than normal.  Trouble concentrating.  Irritability.  Trouble sleeping.  Feeling depressed.  Restlessness and agitation.  Craving a cigarette. To manage withdrawal symptoms:  Avoid places, people, and activities that trigger your cravings.  Remember why you want to quit.  Get plenty of sleep.  Avoid coffee and other caffeinated drinks. These may worsen some of your  symptoms. How can I handle social situations? Social situations can be difficult when you are quitting smoking, especially in the first few weeks. To manage this, you can:  Avoid parties, bars, and other social situations where people might be smoking.  Avoid alcohol.  Leave right away if you have the urge to smoke.  Explain to your family and friends that you are quitting smoking. Ask for understanding and support.  Plan activities with friends or family where smoking is not an option. What are some ways I can cope with stress? Wanting to smoke may cause stress, and stress can make you want to smoke. Find ways to manage your stress. Relaxation techniques can help. For example:  Breathe slowly and deeply, in through your nose and out through your mouth.  Listen to soothing, relaxing music.  Talk with a family member or friend about your stress.  Light a candle.  Soak in a bath or take a shower.  Think about a peaceful place. What are some ways I can prevent weight gain? Be aware that many people gain weight after they quit smoking. However, not everyone does. To keep from gaining weight, have a plan in place before you quit and stick to the plan after you quit. Your plan should include:  Having healthy snacks. When you have a craving, it may help to: ? Eat plain popcorn, crunchy carrots, celery, or other cut vegetables. ? Chew sugar-free gum.  Changing how you eat: ? Eat small portion sizes at meals. ? Eat 4-6 small meals   throughout the day instead of 1-2 large meals a day. ? Be mindful when you eat. Do not watch television or do other things that might distract you as you eat.  Exercising regularly: ? Make time to exercise each day. If you do not have time for a long workout, do short bouts of exercise for 5-10 minutes several times a day. ? Do some form of strengthening exercise, like weight lifting, and some form of aerobic exercise, like running or swimming.  Drinking  plenty of water or other low-calorie or no-calorie drinks. Drink 6-8 glasses of water daily, or as much as instructed by your health care provider. Summary  Quitting smoking is a physical and mental challenge. You will face cravings, withdrawal symptoms, and temptation to smoke again. Preparation can help you as you go through these challenges.  You can cope with cravings by keeping your mouth busy (such as by chewing gum), keeping your body and hands busy, and making calls to family, friends, or a helpline for people who want to quit smoking.  You can cope with withdrawal symptoms by avoiding places where people smoke, avoiding drinks with caffeine, and getting plenty of rest.  Ask your health care provider about the different ways to prevent weight gain, avoid stress, and handle social situations. This information is not intended to replace advice given to you by your health care provider. Make sure you discuss any questions you have with your health care provider. Document Revised: 03/24/2017 Document Reviewed: 04/08/2016 Elsevier Patient Education  2020 [employer]  

## 2020-01-02 LAB — RENAL FUNCTION PANEL
Albumin: 3.1 g/dL — ABNORMAL LOW (ref 3.8–4.8)
BUN/Creatinine Ratio: 9 — ABNORMAL LOW (ref 12–28)
BUN: 6 mg/dL — ABNORMAL LOW (ref 8–27)
CO2: 24 mmol/L (ref 20–29)
Calcium: 8.4 mg/dL — ABNORMAL LOW (ref 8.7–10.3)
Chloride: 92 mmol/L — ABNORMAL LOW (ref 96–106)
Creatinine, Ser: 0.68 mg/dL (ref 0.57–1.00)
GFR calc Af Amer: 108 mL/min/{1.73_m2} (ref >59)
GFR calc non Af Amer: 93 mL/min/{1.73_m2} (ref >59)
Glucose: 109 mg/dL — ABNORMAL HIGH (ref 65–99)
Phosphorus: 3.9 mg/dL (ref 3.0–4.3)
Potassium: 3.7 mmol/L (ref 3.5–5.2)
Sodium: 134 mmol/L (ref 134–144)

## 2020-01-02 LAB — LIPID PANEL WITH LDL/HDL RATIO
Cholesterol, Total: 173 mg/dL (ref 100–199)
HDL: 14 mg/dL — ABNORMAL LOW (ref >39)
LDL Chol Calc (NIH): 124 mg/dL — ABNORMAL HIGH (ref 0–99)
LDL/HDL Ratio: 8.9 ratio — ABNORMAL HIGH (ref 0.0–3.2)
Triglycerides: 195 mg/dL — ABNORMAL HIGH (ref 0–149)
VLDL Cholesterol Cal: 35 mg/dL (ref 5–40)

## 2020-01-02 LAB — MICROALBUMIN, URINE: Microalbumin, Urine: 78 ug/mL

## 2020-01-02 LAB — HEMOGLOBIN A1C
Est. average glucose Bld gHb Est-mCnc: 143 mg/dL
Hgb A1c MFr Bld: 6.6 % — ABNORMAL HIGH (ref 4.8–5.6)

## 2020-01-25 IMAGING — CT CT ABDOMEN AND PELVIS WITH CONTRAST
1 of 3 series · 13 of 32 positions shown, 18 images · IV contrast (APPLIED)
Comparison: None.

CLINICAL DATA: Foul discharge from the vagina

EXAM:
CT ABDOMEN AND PELVIS WITH CONTRAST
TECHNIQUE: Multidetector CT imaging of the abdomen and pelvis was performed
using the standard protocol following bolus administration of
intravenous contrast.
CONTRAST:  100mL F99EZL-MXX IOPAMIDOL (F99EZL-MXX) INJECTION 61%

[Series 2: axial st · axial · 0.98mm/px · z∈[-1099,-639]mm · 13 of 104 slices shown, 18 images]
[im 6/104  soft-tissue]
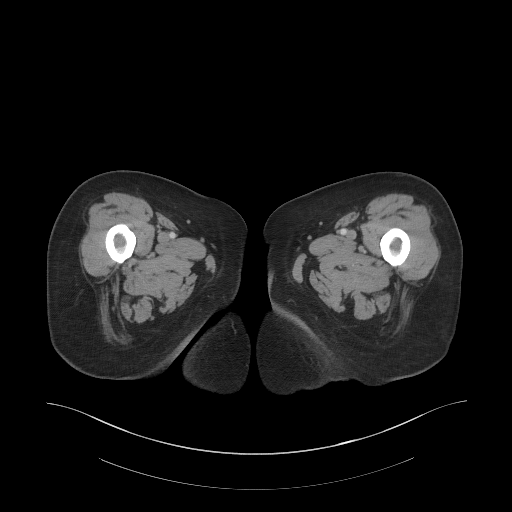
[im 6/104  bone]
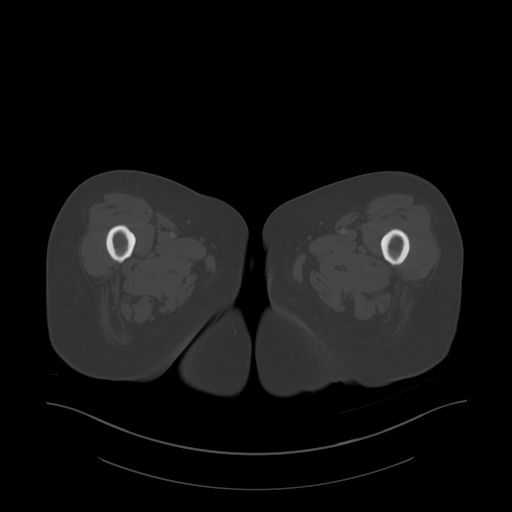
[im 18/104  soft-tissue]
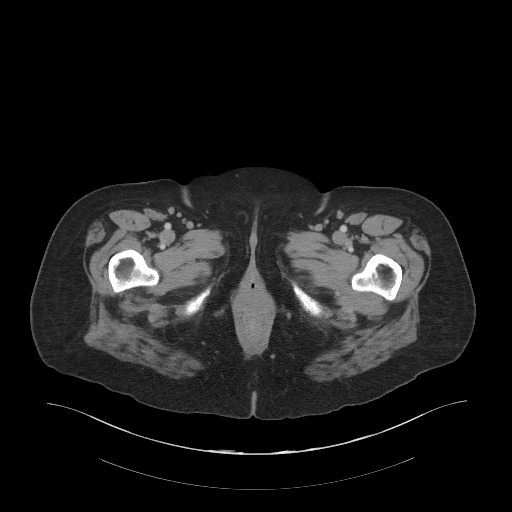
[im 23/104  soft-tissue]
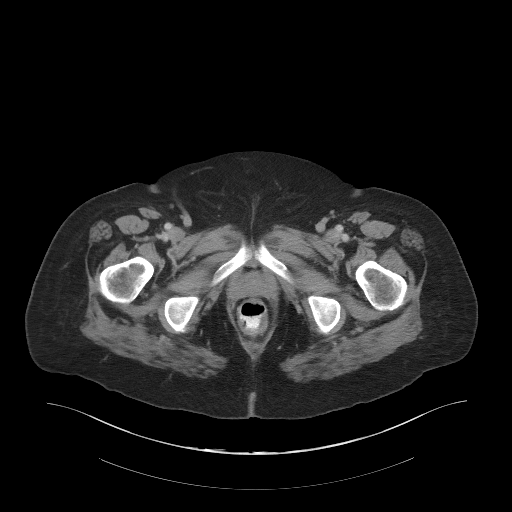
[im 29/104  soft-tissue]
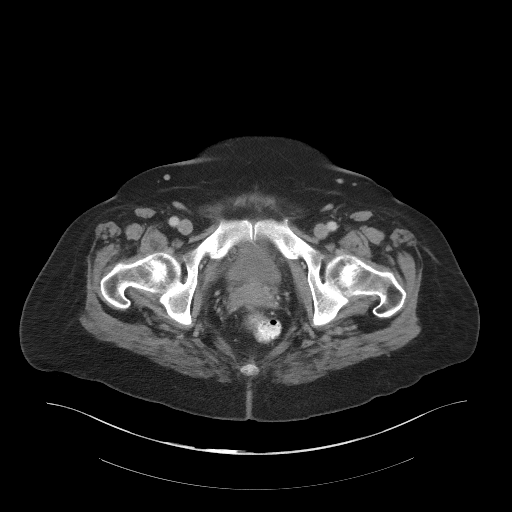
[im 41/104  soft-tissue]
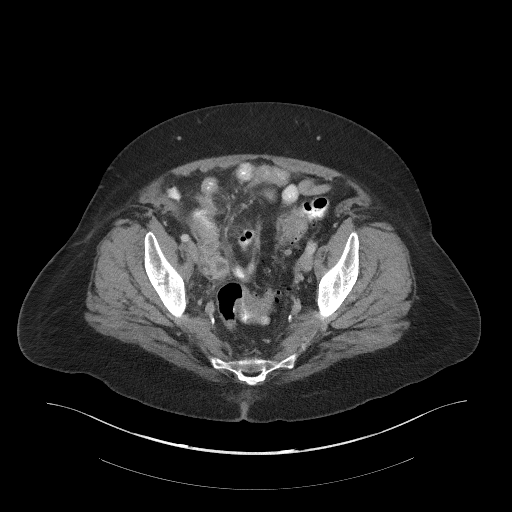
[im 46/104  soft-tissue]
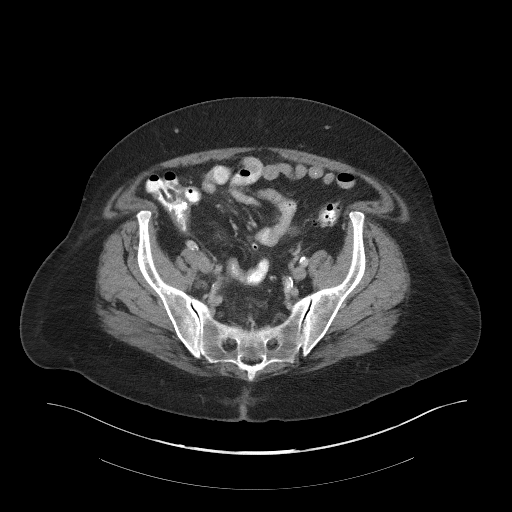
[im 58/104  soft-tissue]
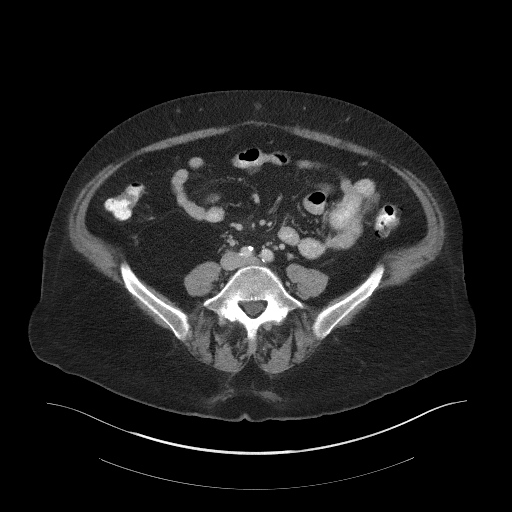
[im 63/104  soft-tissue]
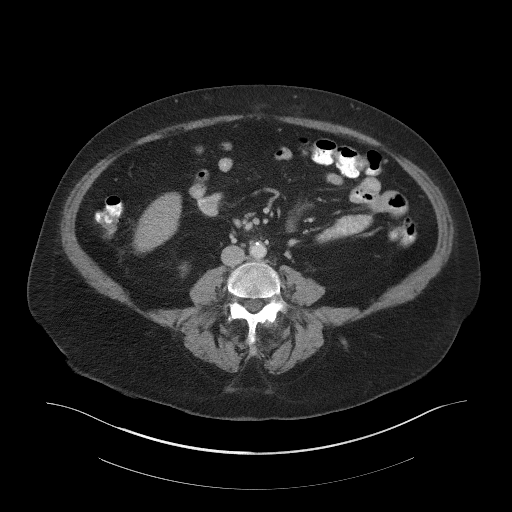
[im 75/104  soft-tissue]
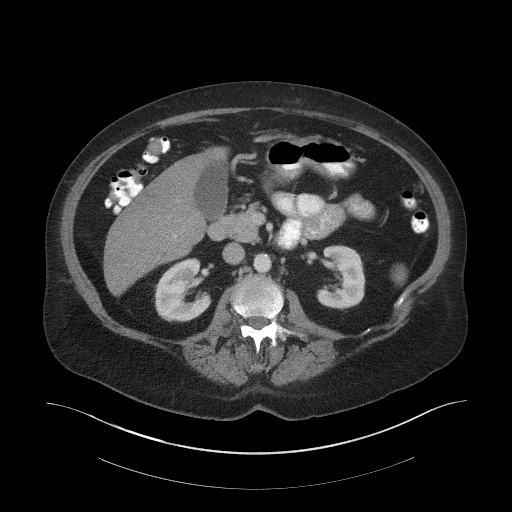
[im 75/104  bone]
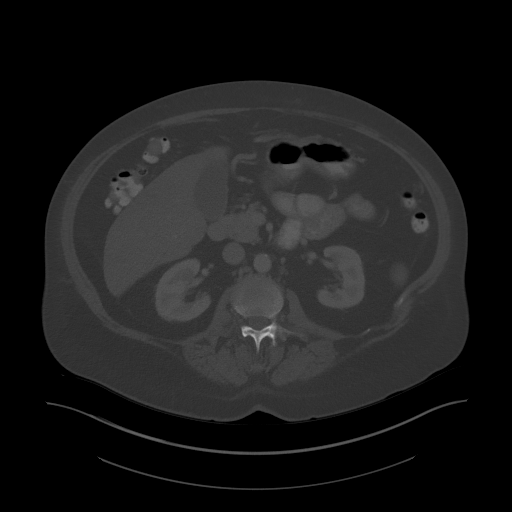
[im 81/104  soft-tissue]
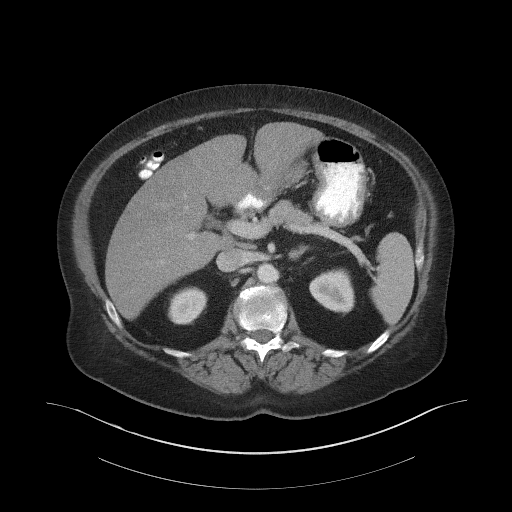
[im 81/104  lung]
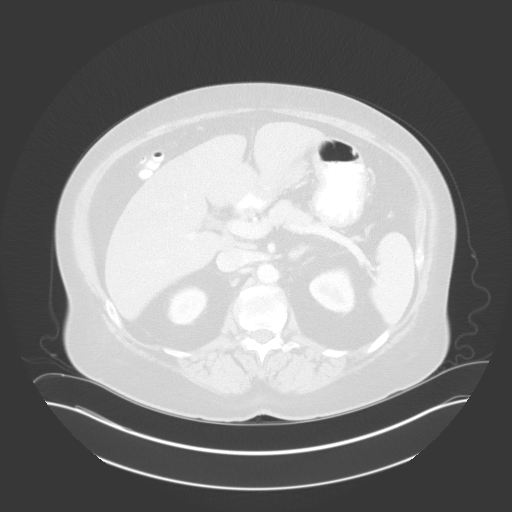
[im 86/104  soft-tissue]
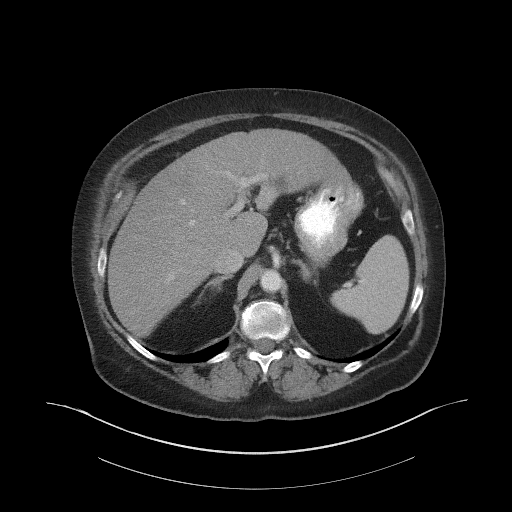
[im 86/104  lung]
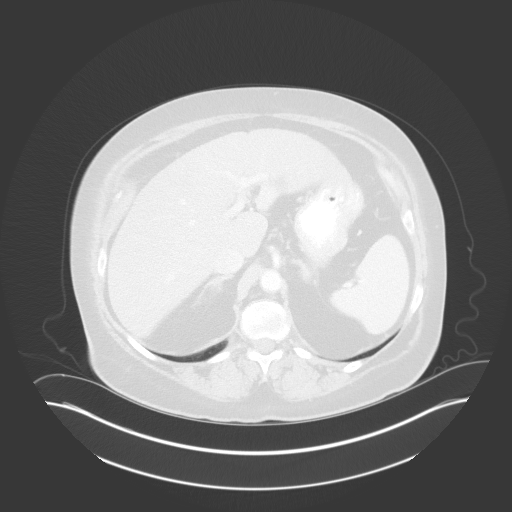
[im 92/104  lung]
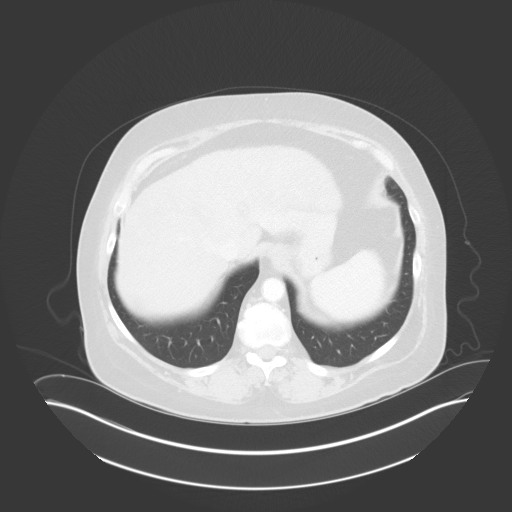
[im 98/104  soft-tissue]
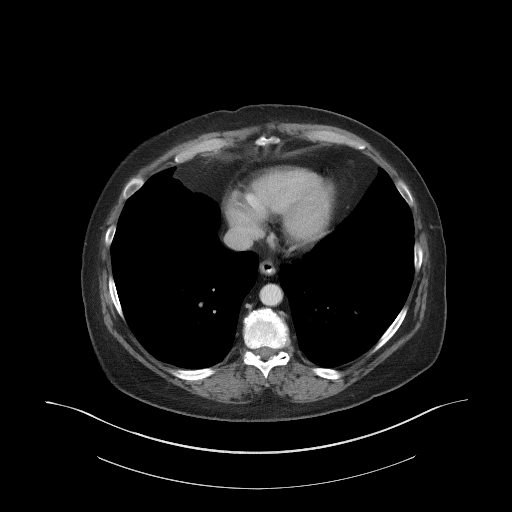
[im 98/104  lung]
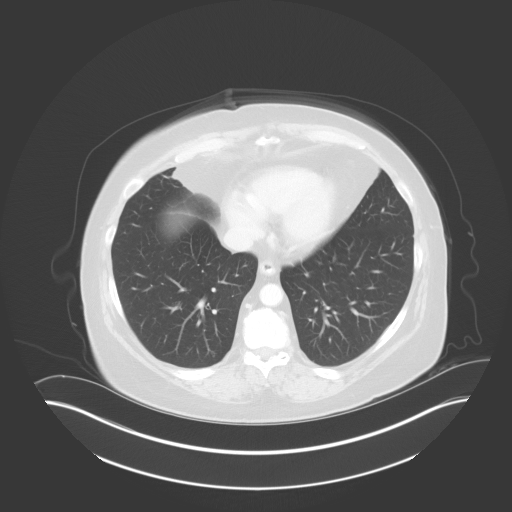

[13 of 32 positions shown; findings below may reference images not displayed]

FINDINGS: Lower chest: No acute abnormality.

Hepatobiliary: Fatty infiltration of the liver is noted. The
gallbladder is within normal limits.

Pancreas: Unremarkable. No pancreatic ductal dilatation or
surrounding inflammatory changes.

Spleen: Normal in size without focal abnormality.

Adrenals/Urinary Tract: Adrenal glands are unremarkable. The kidneys
demonstrate no renal calculi or obstructive changes. Normal
excretion of contrast material is seen. The bladder is decompressed.

Stomach/Bowel: There are changes consistent with diverticulitis
involving the sigmoid colon. There are multiple rounded fluid
collections with peripheral enhancement identified near the sigmoid
colon and right lower quadrant consistent with multifocal abscesses.
One of these best seen on image number 67 of series 2 measures
by 1.7 cm. Smaller lesions are noted more inferiorly adjacent to the
bladder and the uterus and vaginal vault. Air is noted within the
vaginal vault suspicious for fistulization. The more proximal colon
shows diverticular change but is otherwise within normal limits. The
appendix is unremarkable. Small bowel shows no obstructive changes
although some wall thickening is noted distally reactive to the
adjacent inflammatory changes.

Vascular/Lymphatic: Aortic atherosclerosis. No enlarged abdominal or
pelvic lymph nodes.

Reproductive: Adjacent to the vaginal vault there are multiple
previously described rounded abscess is identified. Air is again
noted within the vaginal vault suspicious for fistulization. The
uterus is not well visualized although the patient does not give a
history of prior hysterectomy. It may be obscured by the
inflammatory changes related to: And multifocal abscesses.

Other: No abdominal wall hernia or abnormality. No abdominopelvic
ascites.

Musculoskeletal: No acute or significant osseous findings.
IMPRESSION: Multifocal abscesses within the pelvis particularly within the right
lower quadrant. The largest of these measures 4.4 x 1.7 cm. Air is
noted within the vaginal vault in these changes are suspicious for
fistulization from the colon to the vagina. Uterus is not well
visualized as described above.

Fatty liver.

These results will be called to the ordering clinician or
representative by the Radiologist Assistant, and communication
documented in the PACS or zVision Dashboard.

## 2020-05-06 ENCOUNTER — Ambulatory Visit: Payer: BC Managed Care – PPO | Admitting: Family Medicine

## 2020-06-01 DIAGNOSIS — Z932 Ileostomy status: Secondary | ICD-10-CM

## 2020-06-25 ENCOUNTER — Other Ambulatory Visit: Payer: Self-pay

## 2020-06-25 ENCOUNTER — Encounter: Payer: Self-pay | Admitting: Family Medicine

## 2020-06-25 ENCOUNTER — Ambulatory Visit: Payer: BC Managed Care – PPO | Admitting: Family Medicine

## 2020-06-25 VITALS — BP 128/68 | HR 80 | Ht 66.0 in | Wt 176.0 lb

## 2020-06-25 DIAGNOSIS — I10 Essential (primary) hypertension: Secondary | ICD-10-CM | POA: Diagnosis not present

## 2020-06-25 DIAGNOSIS — F3341 Major depressive disorder, recurrent, in partial remission: Secondary | ICD-10-CM | POA: Diagnosis not present

## 2020-06-25 DIAGNOSIS — E119 Type 2 diabetes mellitus without complications: Secondary | ICD-10-CM | POA: Diagnosis not present

## 2020-06-25 DIAGNOSIS — F17210 Nicotine dependence, cigarettes, uncomplicated: Secondary | ICD-10-CM

## 2020-06-25 DIAGNOSIS — E782 Mixed hyperlipidemia: Secondary | ICD-10-CM

## 2020-06-25 NOTE — Progress Notes (Signed)
Date:  06/25/2020   Name:  Brandy Cummings   DOB:  12/02/1955   MRN:  [id number]   Chief Complaint: Diabetes, Hyperlipidemia, and Hypertension  Diabetes She presents for her follow-up diabetic visit. She has type 2 diabetes mellitus. There are no hypoglycemic associated symptoms. Pertinent negatives for hypoglycemia include no dizziness, headaches, nervousness/anxiousness or sweats. There are no diabetic associated symptoms. Pertinent negatives for diabetes include no blurred vision, no chest pain, no fatigue, no polydipsia, no polyphagia, no polyuria and no weakness. There are no hypoglycemic complications. Symptoms are stable. There are no diabetic complications. Pertinent negatives for diabetic complications include no CVA, PVD or retinopathy. Risk factors for coronary artery disease include dyslipidemia and hypertension. Current diabetic treatment includes diet (stopped metformen). She is compliant with treatment all of the time. She is following a generally healthy diet. Meal planning includes avoidance of concentrated sweets and carbohydrate counting. She participates in exercise intermittently.  Hyperlipidemia This is a chronic problem. The current episode started more than 1 month ago. The problem is controlled. Recent lipid tests were reviewed and are normal. She has no history of chronic renal disease, diabetes, hypothyroidism, liver disease, obesity or nephrotic syndrome. There are no known factors aggravating her hyperlipidemia. Pertinent negatives include no chest pain, focal sensory loss, focal weakness, leg pain, myalgias or shortness of breath. Current antihyperlipidemic treatment includes diet change. Compliance problems: not taking.  Risk factors for coronary artery disease include diabetes mellitus, hypertension and dyslipidemia.  Hypertension This is a chronic problem. The current episode started more than 1 year ago. The problem has been gradually improving since onset. The problem is  controlled. Pertinent negatives include no anxiety, blurred vision, chest pain, headaches, malaise/fatigue, neck pain, orthopnea, palpitations, peripheral edema, PND, shortness of breath or sweats. Risk factors for coronary artery disease include smoking/tobacco exposure. The current treatment provides moderate improvement. There are no compliance problems.  There is no history of angina, kidney disease, CAD/MI, CVA, heart failure, left ventricular hypertrophy, PVD or retinopathy. There is no history of chronic renal disease, a hypertension causing med or renovascular disease.    Lab Results  Component Value Date   CREATININE 0.68 01/01/2020   BUN 6 (L) 01/01/2020   NA 134 01/01/2020   K 3.7 01/01/2020   CL 92 (L) 01/01/2020   CO2 24 01/01/2020   Lab Results  Component Value Date   CHOL 173 01/01/2020   HDL 14 (L) 01/01/2020   LDLCALC 124 (H) 01/01/2020   TRIG 195 (H) 01/01/2020   CHOLHDL 5.9 (H) 05/31/2018   No results found for: TSH Lab Results  Component Value Date   HGBA1C 6.6 (H) 01/01/2020   Lab Results  Component Value Date   WBC 9.3 07/02/2019   HGB 15.5 07/02/2019   HCT 45.3 07/02/2019   MCV 104 (H) 07/02/2019   PLT 176 07/02/2019   Lab Results  Component Value Date   ALT 29 07/02/2019   AST 66 (H) 07/02/2019   ALKPHOS 89 07/02/2019   BILITOT 0.4 07/02/2019     Review of Systems  Constitutional: Negative.  Negative for chills, fatigue, fever, malaise/fatigue and unexpected weight change.  HENT: Negative for congestion, ear discharge, ear pain, rhinorrhea, sinus pressure, sneezing and sore throat.   Eyes: Negative for blurred vision, double vision, photophobia, pain, discharge, redness and itching.  Respiratory: Negative for cough, shortness of breath, wheezing and stridor.   Cardiovascular: Negative for chest pain, palpitations, orthopnea and PND.  Gastrointestinal: Negative for abdominal pain,  blood in stool, constipation, diarrhea, nausea and vomiting.   Endocrine: Negative for cold intolerance, heat intolerance, polydipsia, polyphagia and polyuria.  Genitourinary: Negative for dysuria, flank pain, frequency, hematuria, menstrual problem, pelvic pain, urgency, vaginal bleeding and vaginal discharge.  Musculoskeletal: Negative for arthralgias, back pain, myalgias and neck pain.  Skin: Negative for rash.  Allergic/Immunologic: Negative for environmental allergies and food allergies.  Neurological: Negative for dizziness, focal weakness, weakness, light-headedness, numbness and headaches.  Hematological: Negative for adenopathy. Does not bruise/bleed easily.  Psychiatric/Behavioral: Negative for dysphoric mood. The patient is not nervous/anxious.     Patient Active Problem List   Diagnosis Date Noted  . Class 2 severe obesity due to excess calories with serious comorbidity and body mass index (BMI) of 37.0 to 37.9 in adult (HCC) 07/02/2019  . Recurrent major depressive disorder, in partial remission (HCC) 08/02/2016  . Moderate episode of recurrent major depressive disorder (HCC) 08/02/2016  . Essential hypertension 01/26/2015  . Hyperlipidemia 01/26/2015  . Type 2 diabetes mellitus without complication, without long-term current use of insulin (HCC) 01/26/2015    No Known Allergies  Past Surgical History:  Procedure Laterality Date  . ANKLE FRACTURE SURGERY Left   . COLONOSCOPY  10/24/2010   cleared for 5 years- [place]     Social History   Tobacco Use  . Smoking status: Current Every Day Smoker    Packs/day: 1.00    Years: 40.00    Pack years: 40.00    Types: Cigarettes  . Smokeless tobacco: Never Used  Substance Use Topics  . Alcohol use: No    Alcohol/week: 0.0 standard drinks  . Drug use: No     Medication list has been reviewed and updated.  Current Meds  Medication Sig  . gemfibrozil (LOPID) 600 MG tablet Take 1 tablet (600 mg total) by mouth daily.  . metFORMIN (GLUCOPHAGE-XR) 500 MG 24 hr tablet One a day   . metoprolol tartrate (LOPRESSOR) 50 MG tablet Take 1 tablet (50 mg total) by mouth 2 (two) times daily.    PHQ 2/9 Scores 06/25/2020 01/01/2020 07/02/2019 01/01/2019  PHQ - 2 Score 4 2 1  0  PHQ- 9 Score 9 5 2  0    GAD 7 : Generalized Anxiety Score 06/25/2020 07/02/2019  Nervous, Anxious, on Edge 1 0  Control/stop worrying 2 0  Worry too much - different things 1 0  Trouble relaxing 0 0  Restless 1 0  Easily annoyed or irritable 3 1  Afraid - awful might happen 1 0  Total GAD 7 Score 9 1  Anxiety Difficulty Somewhat difficult Not difficult at all    BP Readings from Last 3 Encounters:  06/25/20 128/68  01/01/20 132/80  07/02/19 130/70    Physical Exam Vitals and nursing note reviewed.  Constitutional:      General: She is not in acute distress.    Appearance: She is not diaphoretic.  HENT:     Head: Normocephalic and atraumatic.     Right Ear: External ear normal.     Left Ear: External ear normal.     Nose: Nose normal. No congestion or rhinorrhea.     Mouth/Throat:     Mouth: Oropharynx is clear and moist. Mucous membranes are moist.  Eyes:     General:        Right eye: No discharge.        Left eye: No discharge.     Extraocular Movements: EOM normal.     Conjunctiva/sclera: Conjunctivae normal.  Pupils: Pupils are equal, round, and reactive to light.  Neck:     Thyroid: No thyromegaly.     Vascular: No JVD.  Cardiovascular:     Rate and Rhythm: Normal rate and regular rhythm.     Pulses: Intact distal pulses.     Heart sounds: Normal heart sounds. No murmur heard. No friction rub. No gallop.   Pulmonary:     Effort: Pulmonary effort is normal.     Breath sounds: No wheezing, rhonchi or rales.  Abdominal:     General: Bowel sounds are normal.     Palpations: Abdomen is soft. There is no mass.     Tenderness: There is no abdominal tenderness. There is no guarding or rebound.  Musculoskeletal:        General: No edema. Normal range of motion.     Cervical  back: Normal range of motion and neck supple.  Lymphadenopathy:     Cervical: No cervical adenopathy.  Skin:    General: Skin is warm and dry.  Neurological:     Mental Status: She is alert.     Deep Tendon Reflexes: Reflexes are normal and symmetric.     Wt Readings from Last 3 Encounters:  06/25/20 176 lb (79.8 kg)  01/01/20 197 lb (89.4 kg)  07/02/19 213 lb (96.6 kg)    BP 128/68   Pulse 80   Ht 5\' 6"  (1.676 m)   Wt 176 lb (79.8 kg)   BMI 28.41 kg/m   Assessment and Plan:                                           For various reasons patient has stopped all her medications.  However her blood pressure is in reasonable range and we are rechecking A1c and lipid panel to see what her status of control is at this time.  I have instructed patient to continue with the discontinuance of those 3 medications until we get lab work to determine which direction will go. 1. Type 2 diabetes mellitus without complication, without long-term current use of insulin (HCC) Chronic.  Questionable control.  Stable.  Will check A1c since patient has discontinued her Metformin. - HgB A1c  2. Essential hypertension Chronic.  Controlled.  Stable.  Blood pressure today is 128/68.  We will continue to monitor blood pressure off medication we will recheck an 60 days to 90 days.  3. Mixed hyperlipidemia Chronic.  Questionable control.  Stable.  Patient has not been taking her statin and/or fenofibrate.  And we will check a lipid panel and determine direction with medication thereafter. - Lipid Panel With LDL/HDL Ratio  4. Recurrent major depressive disorder, in partial remission [facility]) Patient relatively new onset but she has been through quite a medical hospital endeavor.  She is doing well with the surgery however there is a PHQ 8 and a gad score of 9.  She does not want to take any medication since "I have stopped all my other medications."  Therefore we will hold on starting medication we will recheck  PHQ and gad score in next visit  5. Cigarette nicotine dependence without complication Unfortunately instead of stopping nicotine she stopped all her other medications as she has not resumed smoking.  At this point in time I do not think we can get her to quit but we have talked to her again about  the importance of quitting in the future.

## 2020-06-26 LAB — LIPID PANEL WITH LDL/HDL RATIO
Cholesterol, Total: 140 mg/dL (ref 100–199)
HDL: 46 mg/dL (ref >39)
LDL Chol Calc (NIH): 65 mg/dL (ref 0–99)
LDL/HDL Ratio: 1.4 ratio (ref 0.0–3.2)
Triglycerides: 171 mg/dL — ABNORMAL HIGH (ref 0–149)
VLDL Cholesterol Cal: 29 mg/dL (ref 5–40)

## 2020-06-26 LAB — HEMOGLOBIN A1C
Est. average glucose Bld gHb Est-mCnc: 105 mg/dL
Hgb A1c MFr Bld: 5.3 % (ref 4.8–5.6)

## 2020-08-24 ENCOUNTER — Telehealth: Payer: Self-pay

## 2020-08-24 NOTE — Telephone Encounter (Signed)
Called pt and left message to call office and let us know if she wants to schedule breast exam with Yetta Barre and THEN we will schedule her mammo

## 2020-10-21 ENCOUNTER — Other Ambulatory Visit: Payer: Self-pay

## 2020-10-21 ENCOUNTER — Encounter: Payer: Self-pay | Admitting: Family Medicine

## 2020-10-21 ENCOUNTER — Ambulatory Visit: Payer: BC Managed Care – PPO | Admitting: Family Medicine

## 2020-10-21 VITALS — BP 158/82 | HR 66 | Ht 66.0 in | Wt 178.0 lb

## 2020-10-21 DIAGNOSIS — F3341 Major depressive disorder, recurrent, in partial remission: Secondary | ICD-10-CM | POA: Diagnosis not present

## 2020-10-21 DIAGNOSIS — E782 Mixed hyperlipidemia: Secondary | ICD-10-CM

## 2020-10-21 DIAGNOSIS — I10 Essential (primary) hypertension: Secondary | ICD-10-CM | POA: Diagnosis not present

## 2020-10-21 DIAGNOSIS — E119 Type 2 diabetes mellitus without complications: Secondary | ICD-10-CM | POA: Diagnosis not present

## 2020-10-21 DIAGNOSIS — B3731 Acute candidiasis of vulva and vagina: Secondary | ICD-10-CM

## 2020-10-21 DIAGNOSIS — F17210 Nicotine dependence, cigarettes, uncomplicated: Secondary | ICD-10-CM

## 2020-10-21 DIAGNOSIS — B373 Candidiasis of vulva and vagina: Secondary | ICD-10-CM

## 2020-10-21 MED ORDER — NYSTATIN 100000 UNIT/GM EX CREA
1.0000 | TOPICAL_CREAM | Freq: Two times a day (BID) | CUTANEOUS | 6 refills | Status: DC
Start: 2020-10-21 — End: 2020-12-03

## 2020-10-21 MED ORDER — SERTRALINE HCL 25 MG PO TABS: 25.0000 mg | ORAL_TABLET | Freq: Every day | ORAL | 1 refills | Status: DC

## 2020-10-21 NOTE — Progress Notes (Signed)
Date:  10/21/2020   Name:  Brandy Cummings   DOB:  04/06/1956   MRN:  [id number]   Chief Complaint: Diabetes, Hypertension, and Hyperlipidemia  Diabetes She presents for her follow-up diabetic visit. She has type 2 diabetes mellitus. Her disease course has been stable. Hypoglycemia symptoms include headaches and nervousness/anxiousness. Pertinent negatives for hypoglycemia include no dizziness or sweats. Associated symptoms include fatigue. Pertinent negatives for diabetes include no blurred vision, no chest pain and no polydipsia. There are no hypoglycemic complications. Symptoms are stable. There are no diabetic complications. Pertinent negatives for diabetic complications include no CVA, PVD or retinopathy. Risk factors for coronary artery disease include hypertension. Current diabetic treatment includes diet. Her home blood glucose trend is fluctuating minimally.  Hypertension This is a chronic problem. The current episode started more than 1 year ago. The problem has been gradually worsening since onset. The problem is uncontrolled. Associated symptoms include headaches. Pertinent negatives include no anxiety, blurred vision, chest pain, malaise/fatigue, neck pain, orthopnea, palpitations, peripheral edema, PND, shortness of breath or sweats. There are no associated agents to hypertension. There are no known risk factors for coronary artery disease. Past treatments include nothing. The current treatment provides mild improvement. There are no compliance problems.  There is no history of angina, kidney disease, CAD/MI, CVA, heart failure, left ventricular hypertrophy, PVD or retinopathy. There is no history of chronic renal disease, a hypertension causing med or renovascular disease.  Hyperlipidemia The current episode started more than 1 year ago. The problem is controlled. She has no history of chronic renal disease. Pertinent negatives include no chest pain, focal sensory loss, focal weakness, leg  pain, myalgias or shortness of breath. She is currently on no antihyperlipidemic treatment. The current treatment provides mild improvement of lipids. Risk factors for coronary artery disease include hypertension.  Depression        This is a chronic problem.  The current episode started more than 1 year ago.   The problem occurs constantly.  The problem has been waxing and waning since onset.  Associated symptoms include fatigue, helplessness, irritable, restlessness, headaches and sad.  Associated symptoms include no myalgias and no suicidal ideas.     The symptoms are aggravated by medication.  Past treatments include nothing.  Previous treatment provided moderate relief.   Pertinent negatives include no anxiety.  Lab Results  Component Value Date   CREATININE 0.68 01/01/2020   BUN 6 (L) 01/01/2020   NA 134 01/01/2020   K 3.7 01/01/2020   CL 92 (L) 01/01/2020   CO2 24 01/01/2020   Lab Results  Component Value Date   CHOL 140 06/25/2020   HDL 46 06/25/2020   LDLCALC 65 06/25/2020   TRIG 171 (H) 06/25/2020   CHOLHDL 5.9 (H) 05/31/2018   No results found for: TSH Lab Results  Component Value Date   HGBA1C 5.3 06/25/2020   Lab Results  Component Value Date   WBC 9.3 07/02/2019   HGB 15.5 07/02/2019   HCT 45.3 07/02/2019   MCV 104 (H) 07/02/2019   PLT 176 07/02/2019   Lab Results  Component Value Date   ALT 29 07/02/2019   AST 66 (H) 07/02/2019   ALKPHOS 89 07/02/2019   BILITOT 0.4 07/02/2019     Review of Systems  Constitutional:  Positive for fatigue. Negative for chills, fever and malaise/fatigue.  HENT:  Negative for drooling, ear discharge, ear pain and sore throat.   Eyes:  Negative for blurred vision.  Respiratory:  Negative for cough, shortness of breath and wheezing.   Cardiovascular:  Negative for chest pain, palpitations, orthopnea, leg swelling and PND.  Gastrointestinal:  Negative for abdominal pain, blood in stool, constipation, diarrhea and nausea.   Endocrine: Negative for polydipsia.  Genitourinary:  Negative for dysuria, frequency, hematuria and urgency.  Musculoskeletal:  Positive for arthralgias and back pain. Negative for myalgias and neck pain.  Skin:  Negative for rash.  Allergic/Immunologic: Negative for environmental allergies.  Neurological:  Positive for headaches. Negative for dizziness and focal weakness.  Hematological:  Does not bruise/bleed easily.  Psychiatric/Behavioral:  Positive for depression. Negative for suicidal ideas. The patient is nervous/anxious.    Patient Active Problem List   Diagnosis Date Noted   Class 2 severe obesity due to excess calories with serious comorbidity and body mass index (BMI) of 37.0 to 37.9 in adult [facility]) 07/02/2019   Recurrent major depressive disorder, in partial remission (HCC) 08/02/2016   Moderate episode of recurrent major depressive disorder (HCC) 08/02/2016   Essential hypertension 01/26/2015   Hyperlipidemia 01/26/2015   Type 2 diabetes mellitus without complication, without long-term current use of insulin (HCC) 01/26/2015    No Known Allergies  Past Surgical History:  Procedure Laterality Date   ANKLE FRACTURE SURGERY Left    COLONOSCOPY  10/24/2010   cleared for 5 years- [place]     Social History   Tobacco Use   Smoking status: Every Day    Packs/day: 1.00    Years: 40.00    Pack years: 40.00    Types: Cigarettes   Smokeless tobacco: Never  Substance Use Topics   Alcohol use: No    Alcohol/week: 0.0 standard drinks   Drug use: No     Medication list has been reviewed and updated.  No outpatient medications have been marked as taking for the 10/21/20 encounter (Office Visit) with Duanne Limerick, MD.    [facility] 2/9 Scores 10/21/2020 06/25/2020 01/01/2020 07/02/2019  PHQ - 2 Score 5 4 2 1   PHQ- 9 Score 10 9 5 2     GAD 7 : Generalized Anxiety Score 10/21/2020 06/25/2020 07/02/2019  Nervous, Anxious, on Edge 2 1 0  Control/stop worrying 2 2 0  Worry too much -  different things 2 1 0  Trouble relaxing 0 0 0  Restless 0 1 0  Easily annoyed or irritable 2 3 1   Afraid - awful might happen 1 1 0  Total GAD 7 Score 9 9 1   Anxiety Difficulty Somewhat difficult Somewhat difficult Not difficult at all    BP Readings from Last 3 Encounters:  10/21/20 (!) 168/98  06/25/20 128/68  01/01/20 132/80    Physical Exam Vitals and nursing note reviewed.  Constitutional:      General: She is irritable. She is not in acute distress.    Appearance: She is not diaphoretic.  HENT:     Head: Normocephalic and atraumatic.     Right Ear: Tympanic membrane and external ear normal. There is no impacted cerumen.     Left Ear: Tympanic membrane and external ear normal. There is no impacted cerumen.     Nose: Nose normal. No congestion.  Eyes:     General:        Right eye: No discharge.        Left eye: No discharge.     Conjunctiva/sclera: Conjunctivae normal.     Pupils: Pupils are equal, round, and reactive to light.  Neck:     Thyroid: No thyromegaly.  Vascular: No JVD.  Cardiovascular:     Rate and Rhythm: Normal rate and regular rhythm.     Heart sounds: Normal heart sounds. No murmur heard.   No friction rub. No gallop.  Pulmonary:     Effort: Pulmonary effort is normal.     Breath sounds: Normal breath sounds. No wheezing or rhonchi.  Abdominal:     General: Bowel sounds are normal.     Palpations: Abdomen is soft. There is no mass.     Tenderness: There is no abdominal tenderness. There is no guarding.  Musculoskeletal:        General: Normal range of motion.     Cervical back: Normal range of motion and neck supple.  Lymphadenopathy:     Cervical: No cervical adenopathy.  Skin:    General: Skin is warm and dry.  Neurological:     Mental Status: She is alert.     Deep Tendon Reflexes: Reflexes are normal and symmetric.    Wt Readings from Last 3 Encounters:  10/21/20 178 lb (80.7 kg)  06/25/20 176 lb (79.8 kg)  01/01/20 197 lb  (89.4 kg)    BP (!) 168/98   Pulse 66   Ht 5\' 6"  (1.676 m)   Wt 178 lb (80.7 kg)   BMI 28.73 kg/m   Assessment and Plan:  1. Type 2 diabetes mellitus without complication, without long-term current use of insulin (HCC) Chronic.  Controlled.  Stable.  Last A1c was 5.6 and patient is only treating with dietary approach.  We will recheck A1c and hopefully will be able to continue at the present regimen. - Renal Function Panel - HgB A1c - Microalbumin, urine  2. Essential hypertension Chronic.  I think is controlled.  Stable.  Patient states that she has checked her blood pressure at home and it is in the 1 teens and 120 systolic range and 60-70 diastolic range but she is nervous when she comes to the doctor.  She is somewhat animated today about needing something for her nerves but we have discussed that it would be difficult to use benzodiazepines.  We will check a renal function panel to see what her renal function and electrolytes are at this time and we will continue with current regimen of dietary approach for blood pressure concern. - Renal Function Panel  3. Mixed hyperlipidemia Chronic.  Controlled.  Stable.  Will check lipid panel for current status of control. - Lipid Panel With LDL/HDL Ratio  4. Recurrent major depressive disorder, in partial remission (HCC)  Chronic.  Uncontrolled.  Stable.  Complicated.  PHQ 10 Gad score is 9.  Patient has multiple complications in her life including taking care of a parent that is medically challenging and has had recent abdominal surgery with persistent discomfort.  Patient has been on Prozac in the past so I know she can tolerate this and has not had issues with impulsivity.  We will start with sertraline 25 mg and will recheck in 6 weeks but I doubt that patient may return for reevaluation.  Patient denies any suicidal thoughts but she has a lot of medical issues that are currently active.  Psychiatric evaluation has been offered and  declined by patient. - sertraline (ZOLOFT) 25 MG tablet; Take 1 tablet (25 mg total) by mouth daily.  Dispense: 30 tablet; Refill: 1  5. Cigarette nicotine dependence without complication Patient has been advised of the health risks of smoking and counseled concerning cessation of tobacco products. I spent over  3 minutes for discussion and to answer questions.   6. Vulval candidiasis Currently has been using topical steroid which is probably irritating the candidiasis.  I have suggested using some nystatin cream and have provided. - nystatin cream (MYCOSTATIN); Apply 1 application topically 2 (two) times daily.  Dispense: 30 g; Refill: 6

## 2020-10-22 LAB — RENAL FUNCTION PANEL
Albumin: 4.6 g/dL (ref 3.8–4.8)
BUN/Creatinine Ratio: 7 — ABNORMAL LOW (ref 12–28)
BUN: 4 mg/dL — ABNORMAL LOW (ref 8–27)
CO2: 22 mmol/L (ref 20–29)
Calcium: 9.3 mg/dL (ref 8.7–10.3)
Chloride: 98 mmol/L (ref 96–106)
Creatinine, Ser: 0.61 mg/dL (ref 0.57–1.00)
Glucose: 86 mg/dL (ref 65–99)
Phosphorus: 3.5 mg/dL (ref 3.0–4.3)
Potassium: 3.6 mmol/L (ref 3.5–5.2)
Sodium: 140 mmol/L (ref 134–144)
eGFR: 100 mL/min/{1.73_m2} (ref >59)

## 2020-10-22 LAB — HEMOGLOBIN A1C
Est. average glucose Bld gHb Est-mCnc: 108 mg/dL
Hgb A1c MFr Bld: 5.4 % (ref 4.8–5.6)

## 2020-10-22 LAB — LIPID PANEL WITH LDL/HDL RATIO
Cholesterol, Total: 212 mg/dL — ABNORMAL HIGH (ref 100–199)
HDL: 79 mg/dL (ref >39)
LDL Chol Calc (NIH): 108 mg/dL — ABNORMAL HIGH (ref 0–99)
LDL/HDL Ratio: 1.4 ratio (ref 0.0–3.2)
Triglycerides: 144 mg/dL (ref 0–149)
VLDL Cholesterol Cal: 25 mg/dL (ref 5–40)

## 2020-10-22 LAB — MICROALBUMIN, URINE: Microalbumin, Urine: 19 ug/mL

## 2020-12-01 ENCOUNTER — Inpatient Hospital Stay
Admission: RE | Admit: 2020-12-01 | Discharge: 2020-12-03 | DRG: 885 | Disposition: A | Discharge status: Home / Self Care | Payer: BC Managed Care – PPO | Source: Intra-hospital | Attending: Behavioral Health | Admitting: Behavioral Health

## 2020-12-01 ENCOUNTER — Other Ambulatory Visit: Payer: Self-pay

## 2020-12-01 ENCOUNTER — Emergency Department (EMERGENCY_DEPARTMENT_HOSPITAL)
Admission: EM | Admit: 2020-12-01 | Discharge: 2020-12-01 | Disposition: A | Discharge status: Home / Self Care | Payer: BC Managed Care – PPO | Source: Home / Self Care | Attending: Emergency Medicine | Admitting: Emergency Medicine

## 2020-12-01 ENCOUNTER — Encounter: Payer: Self-pay | Admitting: Psychiatry

## 2020-12-01 DIAGNOSIS — F1010 Alcohol abuse, uncomplicated: Secondary | ICD-10-CM | POA: Diagnosis present

## 2020-12-01 DIAGNOSIS — Z833 Family history of diabetes mellitus: Secondary | ICD-10-CM

## 2020-12-01 DIAGNOSIS — Z20822 Contact with and (suspected) exposure to covid-19: Secondary | ICD-10-CM | POA: Insufficient documentation

## 2020-12-01 DIAGNOSIS — F332 Major depressive disorder, recurrent severe without psychotic features: Secondary | ICD-10-CM | POA: Insufficient documentation

## 2020-12-01 DIAGNOSIS — F10920 Alcohol use, unspecified with intoxication, uncomplicated: Secondary | ICD-10-CM

## 2020-12-01 DIAGNOSIS — Z7984 Long term (current) use of oral hypoglycemic drugs: Secondary | ICD-10-CM | POA: Insufficient documentation

## 2020-12-01 DIAGNOSIS — E119 Type 2 diabetes mellitus without complications: Secondary | ICD-10-CM | POA: Diagnosis present

## 2020-12-01 DIAGNOSIS — F10120 Alcohol abuse with intoxication, uncomplicated: Secondary | ICD-10-CM | POA: Diagnosis present

## 2020-12-01 DIAGNOSIS — R45851 Suicidal ideations: Secondary | ICD-10-CM | POA: Insufficient documentation

## 2020-12-01 DIAGNOSIS — Z8249 Family history of ischemic heart disease and other diseases of the circulatory system: Secondary | ICD-10-CM

## 2020-12-01 DIAGNOSIS — G4700 Insomnia, unspecified: Secondary | ICD-10-CM | POA: Diagnosis present

## 2020-12-01 DIAGNOSIS — F17210 Nicotine dependence, cigarettes, uncomplicated: Secondary | ICD-10-CM | POA: Diagnosis present

## 2020-12-01 DIAGNOSIS — Z79899 Other long term (current) drug therapy: Secondary | ICD-10-CM

## 2020-12-01 DIAGNOSIS — Z932 Ileostomy status: Secondary | ICD-10-CM

## 2020-12-01 DIAGNOSIS — I10 Essential (primary) hypertension: Secondary | ICD-10-CM | POA: Diagnosis present

## 2020-12-01 DIAGNOSIS — E782 Mixed hyperlipidemia: Secondary | ICD-10-CM | POA: Diagnosis present

## 2020-12-01 DIAGNOSIS — F322 Major depressive disorder, single episode, severe without psychotic features: Principal | ICD-10-CM | POA: Diagnosis present

## 2020-12-01 DIAGNOSIS — F439 Reaction to severe stress, unspecified: Secondary | ICD-10-CM | POA: Diagnosis present

## 2020-12-01 DIAGNOSIS — Y908 Blood alcohol level of 240 mg/100 ml or more: Secondary | ICD-10-CM | POA: Insufficient documentation

## 2020-12-01 LAB — COMPREHENSIVE METABOLIC PANEL
ALT: 45 U/L — ABNORMAL HIGH (ref 0–44)
AST: 138 U/L — ABNORMAL HIGH (ref 15–41)
Albumin: 4 g/dL (ref 3.5–5.0)
Alkaline Phosphatase: 115 U/L (ref 38–126)
Anion gap: 16 — ABNORMAL HIGH (ref 5–15)
BUN: <5 mg/dL — ABNORMAL LOW (ref 8–23)
CO2: 23 mmol/L (ref 22–32)
Calcium: 8.4 mg/dL — ABNORMAL LOW (ref 8.9–10.3)
Chloride: 95 mmol/L — ABNORMAL LOW (ref 98–111)
Creatinine, Ser: 0.62 mg/dL (ref 0.44–1.00)
GFR, Estimated: >60 mL/min (ref >60)
Glucose, Bld: 85 mg/dL (ref 70–99)
Potassium: 3.5 mmol/L (ref 3.5–5.1)
Sodium: 134 mmol/L — ABNORMAL LOW (ref 135–145)
Total Bilirubin: 1.9 mg/dL — ABNORMAL HIGH (ref 0.3–1.2)
Total Protein: 7.5 g/dL (ref 6.5–8.1)

## 2020-12-01 LAB — ACETAMINOPHEN LEVEL: Acetaminophen (Tylenol), Serum: <10 ug/mL — ABNORMAL LOW (ref 10–30)

## 2020-12-01 LAB — RESP PANEL BY RT-PCR (FLU A&B, COVID) ARPGX2
Influenza A by PCR: NEGATIVE
Influenza B by PCR: NEGATIVE
SARS Coronavirus 2 by RT PCR: NEGATIVE

## 2020-12-01 LAB — CBC
HCT: 44.2 % (ref 36.0–46.0)
Hemoglobin: 16.1 g/dL — ABNORMAL HIGH (ref 12.0–15.0)
MCH: 38.8 pg — ABNORMAL HIGH (ref 26.0–34.0)
MCHC: 36.4 g/dL — ABNORMAL HIGH (ref 30.0–36.0)
MCV: 106.5 fL — ABNORMAL HIGH (ref 80.0–100.0)
Platelets: 114 10*3/uL — ABNORMAL LOW (ref 150–400)
RBC: 4.15 MIL/uL (ref 3.87–5.11)
RDW: 14.2 % (ref 11.5–15.5)
WBC: 4.8 10*3/uL (ref 4.0–10.5)
nRBC: 0 % (ref 0.0–0.2)

## 2020-12-01 LAB — SALICYLATE LEVEL: Salicylate Lvl: <7 mg/dL — ABNORMAL LOW (ref 7.0–30.0)

## 2020-12-01 LAB — ETHANOL: Alcohol, Ethyl (B): 314 mg/dL (ref <10)

## 2020-12-01 MED ORDER — THIAMINE HCL 100 MG/ML IJ SOLN
100.0000 mg | Freq: Every day | INTRAMUSCULAR | Status: DC
  Filled 2020-12-01: qty 1

## 2020-12-01 MED ORDER — SERTRALINE HCL 50 MG PO TABS: 25.0000 mg | ORAL_TABLET | Freq: Every day | ORAL | Status: DC

## 2020-12-01 MED ORDER — THIAMINE HCL 100 MG PO TABS: 100.0000 mg | ORAL_TABLET | Freq: Every day | ORAL | Status: DC

## 2020-12-01 MED ORDER — GEMFIBROZIL 600 MG PO TABS
600.0000 mg | ORAL_TABLET | Freq: Every day | ORAL | Status: DC
  Administered 2020-12-02: 600 mg via ORAL
  Filled 2020-12-01 (×2): qty 1

## 2020-12-01 MED ORDER — LORAZEPAM 2 MG/ML IJ SOLN: 0.0000 mg | Freq: Four times a day (QID) | INTRAMUSCULAR | Status: DC

## 2020-12-01 MED ORDER — ACETAMINOPHEN 325 MG PO TABS: 650.0000 mg | ORAL_TABLET | Freq: Four times a day (QID) | ORAL | Status: DC | PRN

## 2020-12-01 MED ORDER — LORAZEPAM 2 MG/ML IJ SOLN: 0.0000 mg | Freq: Two times a day (BID) | INTRAMUSCULAR | Status: DC

## 2020-12-01 MED ORDER — THIAMINE HCL 100 MG/ML IJ SOLN: 100.0000 mg | Freq: Every day | INTRAMUSCULAR | Status: DC

## 2020-12-01 MED ORDER — ALUM & MAG HYDROXIDE-SIMETH 200-200-20 MG/5ML PO SUSP: 30.0000 mL | ORAL | Status: DC | PRN

## 2020-12-01 MED ORDER — LORAZEPAM 2 MG PO TABS: 0.0000 mg | ORAL_TABLET | Freq: Two times a day (BID) | ORAL | Status: DC

## 2020-12-01 MED ORDER — METFORMIN HCL ER 500 MG PO TB24: 500.0000 mg | ORAL_TABLET | Freq: Every day | ORAL | Status: DC

## 2020-12-01 MED ORDER — GEMFIBROZIL 600 MG PO TABS
600.0000 mg | ORAL_TABLET | Freq: Every day | ORAL | Status: DC
  Filled 2020-12-01: qty 1

## 2020-12-01 MED ORDER — NICOTINE 21 MG/24HR TD PT24
21.0000 mg | MEDICATED_PATCH | Freq: Every day | TRANSDERMAL | Status: DC
  Administered 2020-12-01: 21 mg via TRANSDERMAL
  Filled 2020-12-01: qty 1

## 2020-12-01 MED ORDER — METOPROLOL TARTRATE 25 MG PO TABS
50.0000 mg | ORAL_TABLET | Freq: Two times a day (BID) | ORAL | Status: DC
  Administered 2020-12-02 – 2020-12-03 (×3): 50 mg via ORAL
  Filled 2020-12-01 (×3): qty 2

## 2020-12-01 MED ORDER — THIAMINE HCL 100 MG PO TABS
100.0000 mg | ORAL_TABLET | Freq: Every day | ORAL | Status: DC
  Administered 2020-12-02 – 2020-12-03 (×2): 100 mg via ORAL
  Filled 2020-12-01 (×2): qty 1

## 2020-12-01 MED ORDER — METOPROLOL TARTRATE 25 MG PO TABS
50.0000 mg | ORAL_TABLET | Freq: Two times a day (BID) | ORAL | Status: DC
  Administered 2020-12-01: 50 mg via ORAL
  Filled 2020-12-01: qty 2

## 2020-12-01 MED ORDER — HYDROXYZINE HCL 25 MG PO TABS
25.0000 mg | ORAL_TABLET | Freq: Three times a day (TID) | ORAL | Status: DC | PRN
  Administered 2020-12-01 – 2020-12-03 (×2): 25 mg via ORAL
  Filled 2020-12-01 (×2): qty 1

## 2020-12-01 MED ORDER — MAGNESIUM HYDROXIDE 400 MG/5ML PO SUSP: 30.0000 mL | Freq: Every day | ORAL | Status: DC | PRN

## 2020-12-01 MED ORDER — METFORMIN HCL ER 500 MG PO TB24
500.0000 mg | ORAL_TABLET | Freq: Every day | ORAL | Status: DC
  Filled 2020-12-01: qty 1

## 2020-12-01 MED ORDER — LORAZEPAM 2 MG PO TABS
0.0000 mg | ORAL_TABLET | Freq: Four times a day (QID) | ORAL | Status: DC
  Administered 2020-12-02 (×4): 2 mg via ORAL
  Filled 2020-12-01 (×4): qty 1

## 2020-12-01 MED ORDER — LORAZEPAM 2 MG PO TABS
0.0000 mg | ORAL_TABLET | Freq: Four times a day (QID) | ORAL | Status: DC
  Administered 2020-12-01: 1 mg via ORAL
  Filled 2020-12-01: qty 1

## 2020-12-01 MED ORDER — GEMFIBROZIL 600 MG PO TABS: 600.0000 mg | ORAL_TABLET | Freq: Every day | ORAL | Status: DC

## 2020-12-01 MED ORDER — NICOTINE 21 MG/24HR TD PT24
21.0000 mg | MEDICATED_PATCH | Freq: Every day | TRANSDERMAL | Status: DC
  Administered 2020-12-02 – 2020-12-03 (×2): 21 mg via TRANSDERMAL
  Filled 2020-12-01 (×2): qty 1

## 2020-12-01 MED ORDER — METFORMIN HCL ER 500 MG PO TB24
500.0000 mg | ORAL_TABLET | Freq: Every day | ORAL | Status: DC
  Administered 2020-12-02: 500 mg via ORAL
  Filled 2020-12-01: qty 1

## 2020-12-01 NOTE — ED Notes (Signed)
Patient has come to door multiple times and complains that "pussy hairs" are on her floor, and wants it cleaned up. Reina RN cleaned up the floor. Patient is still walking around complaining about everything.

## 2020-12-01 NOTE — ED Notes (Signed)
Provided phone per request. Pt on phone with sister.   Cleaned floor with purple wipes per pt request. Pt states there were "pussy hairs on the floor" and she didn't want to set her water on floor on "pussy hairs".  Provided new ice water and [place] sandwich tray. Provided mouth moisturizer to pt as pt complaining that not allowed to access own lip gloss from purse.

## 2020-12-01 NOTE — ED Notes (Signed)
Report to Reina, RN 

## 2020-12-01 NOTE — BH Assessment (Addendum)
Patient is to be admitted to [facility] tonight 12/01/20 after 8:00pm by Dr.  Neale Burly .  Attending Physician will be Dr.  Toni Amend .   Patient has been assigned to room 316, by [facility] Charge Nurse Marchelle Folks.    ER staff is aware of the admission: Misty Stanley, ER Secretary   Dr. Erma Heritage, ER MD  Barbee Cough, Patient's Nurse  Ethelene Browns, Patient Access.
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
## 2020-12-01 NOTE — ED Notes (Signed)
Using phone. 

## 2020-12-01 NOTE — ED Notes (Signed)
Alcohol 314 verbal to Dr. Shaune Pollack
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
## 2020-12-01 NOTE — ED Notes (Signed)
Agitated with registration, standing in hallway with loud talking.

## 2020-12-01 NOTE — ED Notes (Addendum)
Pt yelling at this RN, very upset she is at hospital.  Cassandria Santee out with this Ambulance person NT. Belongings include: Tennis shoes Socks [employer] alert necklace   Pt wears depends, left on pt  Pt wearing glasses

## 2020-12-01 NOTE — ED Notes (Signed)
EDP at bedside  

## 2020-12-01 NOTE — ED Notes (Signed)
Patient requested lotion. Squeezed a generous amount in both hands. Patient is back in room at this time.

## 2020-12-01 NOTE — ED Notes (Signed)
Dr.Clapacs at bedside  

## 2020-12-01 NOTE — ED Notes (Signed)
Spoke with Luster Landsberg, RN from inpatient [facility] unit at [facility]. She states pt has assigned bed and they can take pt after 8pm.  Gave report. 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 Renee, RN states pt will be able to come downstairs after 8pm.

## 2020-12-01 NOTE — ED Notes (Signed)
Report given to [facility] in BMU. Patient aware of plan of care and is agreeable with plan.

## 2020-12-01 NOTE — ED Notes (Signed)
Refusing all PO meds.
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
## 2020-12-01 NOTE — ED Provider Notes (Signed)
[facility] Emergency Department Provider Note  ____________________________________________   Event Date/Time   First MD Initiated Contact with Patient 12/01/20 1425     (approximate)  I have reviewed the triage vital signs and the nursing notes.   HISTORY  Chief Complaint No chief complaint on file.    HPI Brandy Cummings is a 65 y.o. female with history of hypertension, hyperlipidemia, diabetes, here with suicidal ideation.  History is somewhat limited as patient is somewhat angry and agitated on exam.  She reports that she has been under significant multiple recent stressors, as well as generally depressed since she had a complicated course of diverticulitis last year which required hospitalization and colostomy.  Patient reports that she has had worsening depression, thoughts of self-harm, and thought about hanging herself.  She initially told nursing that she would do it if she was to leave and that she had ongoing thoughts of harming herself.  She is somewhat evasive when asked about this directly in my assessment.  She does admit to alcohol use.  Denies known psychiatric history although she has been on multiple antidepressants per history.    Past Medical History:  Diagnosis Date   Diabetes mellitus without complication (HCC)    Hyperlipidemia    Hypertension     Patient Active Problem List   Diagnosis Date Noted   Severe recurrent major depression without psychotic features (HCC) 12/01/2020   Alcohol abuse 12/01/2020   Cigarette nicotine dependence without complication 10/21/2020   Class 2 severe obesity due to excess calories with serious comorbidity and body mass index (BMI) of 37.0 to 37.9 in adult [facility]) 07/02/2019   Recurrent major depressive disorder, in partial remission (HCC) 08/02/2016   Moderate episode of recurrent major depressive disorder (HCC) 08/02/2016   Essential hypertension 01/26/2015   Mixed hyperlipidemia 01/26/2015   Type  2 diabetes mellitus without complication, without long-term current use of insulin (HCC) 01/26/2015    Past Surgical History:  Procedure Laterality Date   ANKLE FRACTURE SURGERY Left    COLONOSCOPY  10/24/2010   cleared for 5 years- [place]     Prior to Admission medications   Medication Sig Start Date End Date Taking? Authorizing Provider  nystatin cream (MYCOSTATIN) Apply 1 application topically 2 (two) times daily. 10/21/20  Yes Duanne Limerick, MD  sertraline (ZOLOFT) 25 MG tablet Take 1 tablet (25 mg total) by mouth daily. 10/21/20  Yes Duanne Limerick, MD  gemfibrozil (LOPID) 600 MG tablet Take 600 mg by mouth daily.    [provider]  metFORMIN (GLUCOPHAGE) 500 MG tablet Take 500 mg by mouth daily.    [provider]    Allergies Patient has no known allergies.  Family History  Problem Relation Age of Onset   Diabetes Mother    Cancer Father    Heart disease Father    Cancer Maternal Grandfather    Cancer Paternal Grandmother     Social History Social History   Tobacco Use   Smoking status: Every Day    Packs/day: 1.00    Years: 40.00    Pack years: 40.00    Types: Cigarettes   Smokeless tobacco: Never  Substance Use Topics   Alcohol use: No    Alcohol/week: 0.0 standard drinks   Drug use: No    Review of Systems  Review of Systems  Constitutional:  Negative for chills and fever.  HENT:  Negative for sore throat.   Respiratory:  Negative for shortness of breath.  Cardiovascular:  Negative for chest pain.  Gastrointestinal:  Negative for abdominal pain.  Genitourinary:  Negative for flank pain.  Musculoskeletal:  Negative for neck pain.  Skin:  Negative for rash and wound.  Allergic/Immunologic: Negative for immunocompromised state.  Neurological:  Negative for weakness and numbness.  Hematological:  Does not bruise/bleed easily.  Psychiatric/Behavioral:  Positive for dysphoric mood and suicidal ideas.   All other systems reviewed  and are negative.   ____________________________________________  PHYSICAL EXAM:      VITAL SIGNS: ED Triage Vitals  Enc Vitals Group     BP 12/01/20 1345 (!) 171/88     Pulse Rate 12/01/20 1345 76     Resp 12/01/20 1345 20     Temp 12/01/20 1345 98.3 F (36.8 C)     Temp Source 12/01/20 1345 Oral     SpO2 12/01/20 1345 95 %     Weight 12/01/20 1347 177 lb (80.3 kg)     Height 12/01/20 1347 5\' 6"  (1.676 m)     Head Circumference --      Peak Flow --      Pain Score 12/01/20 1347 0     Pain Loc --      Pain Edu? --      Excl. in GC? --      Physical Exam Vitals and nursing note reviewed.  Constitutional:      General: She is not in acute distress.    Appearance: She is well-developed.  HENT:     Head: Normocephalic and atraumatic.  Eyes:     Conjunctiva/sclera: Conjunctivae normal.  Cardiovascular:     Rate and Rhythm: Normal rate and regular rhythm.     Heart sounds: Normal heart sounds.  Pulmonary:     Effort: Pulmonary effort is normal. No respiratory distress.     Breath sounds: No wheezing.  Abdominal:     General: There is no distension.  Musculoskeletal:     Cervical back: Neck supple.  Skin:    General: Skin is warm.     Capillary Refill: Capillary refill takes less than 2 seconds.     Findings: No rash.  Neurological:     Mental Status: She is alert and oriented to person, place, and time.     Motor: No abnormal muscle tone.  Psychiatric:        Mood and Affect: Mood is depressed. Affect is labile.      ____________________________________________   LABS (all labs ordered are listed, but only abnormal results are displayed)  Labs Reviewed  COMPREHENSIVE METABOLIC PANEL - Abnormal; Notable for the following components:      Result Value   Sodium 134 (*)    Chloride 95 (*)    BUN <5 (*)    Calcium 8.4 (*)    AST 138 (*)    ALT 45 (*)    Total Bilirubin 1.9 (*)    Anion gap 16 (*)    All other components within normal limits  ETHANOL -  Abnormal; Notable for the following components:   Alcohol, Ethyl (B) 314 (*)    All other components within normal limits  SALICYLATE LEVEL - Abnormal; Notable for the following components:   Salicylate Lvl <7.0 (*)    All other components within normal limits  ACETAMINOPHEN LEVEL - Abnormal; Notable for the following components:   Acetaminophen (Tylenol), Serum <10 (*)    All other components within normal limits  CBC - Abnormal; Notable for the following components:  Hemoglobin 16.1 (*)    MCV 106.5 (*)    MCH 38.8 (*)    MCHC 36.4 (*)    Platelets 114 (*)    All other components within normal limits  RESP PANEL BY RT-PCR (FLU A&B, COVID) ARPGX2  URINE DRUG SCREEN, QUALITATIVE ([facility] ONLY)    ____________________________________________  EKG:  ________________________________________  RADIOLOGY All imaging, including plain films, CT scans, and ultrasounds, independently reviewed by me, and interpretations confirmed via formal radiology reads.  ED MD interpretation:     Official radiology report(s): No results found.  ____________________________________________  PROCEDURES   Procedure(s) performed (including Critical Care):  Procedures  ____________________________________________  INITIAL IMPRESSION / MDM / ASSESSMENT AND PLAN / ED COURSE  As part of my medical decision making, I reviewed the following data within the electronic MEDICAL RECORD NUMBER Nursing notes reviewed and incorporated, Old chart reviewed, Notes from prior ED visits, and  Controlled Substance Database       *Brandy Cummings was evaluated in Emergency Department on 12/01/2020 for the symptoms described in the history of present illness. She was evaluated in the context of the global COVID-19 pandemic, which necessitated consideration that the patient might be at risk for infection with the SARS-CoV-2 virus that causes COVID-19. Institutional protocols and algorithms that pertain to the evaluation of  patients at risk for COVID-19 are in a state of rapid change based on information released by regulatory bodies including the CDC and federal and state organizations. These policies and algorithms were followed during the patient's care in the ED.  Some ED evaluations and interventions may be delayed as a result of limited staffing during the pandemic.*     Medical Decision Making: 65 year old female here with suicidal ideation and alcohol intoxication.  Patient endorsing active SI on arrival.  She was subsequently IVC.  Lab work shows elevated alcohol as well as AST and ALT consistent with alcohol abuse.  Will consult TTS and psychiatry.  No apparent emergent medical pathology.  Home meds ordered.  ____________________________________________  FINAL CLINICAL IMPRESSION(S) / ED DIAGNOSES  Final diagnoses:  Suicidal ideation  Alcoholic intoxication without complication (HCC)     MEDICATIONS GIVEN DURING THIS VISIT:  Medications  LORazepam (ATIVAN) injection 0-4 mg ( Intravenous See Alternative 12/01/20 1547)    Or  LORazepam (ATIVAN) tablet 0-4 mg (0 mg Oral Patient Refused/Not Given 12/01/20 1547)  LORazepam (ATIVAN) injection 0-4 mg (has no administration in time range)    Or  LORazepam (ATIVAN) tablet 0-4 mg (has no administration in time range)  thiamine tablet 100 mg (100 mg Oral Patient Refused/Not Given 12/01/20 1547)    Or  thiamine (B-1) injection 100 mg ( Intravenous See Alternative 12/01/20 1547)  metoprolol tartrate (LOPRESSOR) tablet 50 mg (50 mg Oral Patient Refused/Not Given 12/01/20 1547)  gemfibrozil (LOPID) tablet 600 mg (has no administration in time range)  metFORMIN (GLUCOPHAGE-XR) 24 hr tablet 500 mg (has no administration in time range)  nicotine (NICODERM CQ - dosed in mg/24 hours) patch 21 mg (21 mg Transdermal Patch Applied 12/01/20 1600)     ED Discharge Orders     None        Note:  This document was prepared using Dragon voice recognition software and may  include unintentional dictation errors.   Shaune Pollack, MD 12/01/20 [phone]

## 2020-12-01 NOTE — ED Notes (Signed)
Provided chux, wet wipes, towel, for pt to clean self up. Pt changed chux and left soiled chux on bathroom floor. Cleaned up chux from bathroom and cleaned floor. Pt requesting bedside commode. Explained BSC not available in BHU. Pt walked back to room. Angry demeanor.

## 2020-12-01 NOTE — ED Notes (Signed)
Pt up to nurses station knocking on door for third time. Informed pt that nurse will make rounds on pt and does not need to be looking for RN at door. Pt said sarcastically "well thanks for introducing me to the unit" and walked back to room. TV remote provided per pt request.

## 2020-12-01 NOTE — ED Notes (Signed)
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 Pt also stating she wants to leave and that this setting is not healthy for her. Called CN to inform pt having to change diaper chux frequently due to frequent diarrhea. CN will investigate possibility of moving pt to more appropriate setting.  Informed pt will let MD know she is asking to speak to him.

## 2020-12-02 DIAGNOSIS — F322 Major depressive disorder, single episode, severe without psychotic features: Secondary | ICD-10-CM | POA: Diagnosis not present

## 2020-12-02 LAB — LIPID PANEL
Cholesterol: 258 mg/dL — ABNORMAL HIGH (ref 0–200)
HDL: 126 mg/dL (ref >40)
LDL Cholesterol: 115 mg/dL — ABNORMAL HIGH (ref 0–99)
Total CHOL/HDL Ratio: 2 RATIO
Triglycerides: 87 mg/dL (ref <150)
VLDL: 17 mg/dL (ref 0–40)

## 2020-12-02 LAB — HEMOGLOBIN A1C
Hgb A1c MFr Bld: 5.3 % (ref 4.8–5.6)
Mean Plasma Glucose: 105.41 mg/dL

## 2020-12-02 MED ORDER — LOPERAMIDE HCL 2 MG PO CAPS
4.0000 mg | ORAL_CAPSULE | ORAL | Status: DC | PRN
  Administered 2020-12-02 – 2020-12-03 (×4): 4 mg via ORAL
  Filled 2020-12-02 (×4): qty 2

## 2020-12-02 MED ORDER — TRAZODONE HCL 50 MG PO TABS
50.0000 mg | ORAL_TABLET | Freq: Every evening | ORAL | Status: DC | PRN
  Administered 2020-12-02: 50 mg via ORAL
  Filled 2020-12-02: qty 1

## 2020-12-02 NOTE — B[facility] Group Notes (Signed)
LCSW Group Therapy Note  12/02/2020 1:43 PM  Type of Therapy/Topic:  Group Therapy:  Emotion Regulation  Participation Level:  Active   Description of Group:   The purpose of this group is to assist patients in learning to regulate negative emotions and experience positive emotions. Patients will be guided to discuss ways in which they have been vulnerable to their negative emotions. These vulnerabilities will be juxtaposed with experiences of positive emotions or situations, and patients will be challenged to use positive emotions to combat negative ones. Special emphasis will be placed on coping with negative emotions in conflict situations, and patients will process healthy conflict resolution skills.  Therapeutic Goals: Patient will identify two positive emotions or experiences to reflect on in order to balance out negative emotions Patient will label two or more emotions that they find the most difficult to experience Patient will demonstrate positive conflict resolution skills through discussion and/or role plays  Summary of Patient Progress: Patient was present in group.  Patient was active and attentive.  Patient shared that to her emotion regulation means to "be nice and steady".  She reports that prior to her admission she was feeling "fear and wanting to be loved and understood".  She reports that triggers to her negative emotions have been "losing a friend, changes in my life and cleaning out my mothers home".  She reports that she felt like she has not felt like she was functional in 6 years, since she was last employed.  She reports that most recently she felt like she has not managed her mother's care well.  She identifies that she should have spent some time focused on her self-care.   Therapeutic Modalities:   Cognitive Behavioral Therapy Feelings Identification Dialectical Behavioral Therapy  Penni Homans, MSW, LCSW 12/02/2020 1:43 PM
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
## 2020-12-02 NOTE — Progress Notes (Signed)
Recreation Therapy Notes   Date: 12/02/2020  Time: 10:00 am   Location: Craft room   Behavioral response: N/A   Intervention Topic: Decision Making    Discussion/Intervention: Patient did not attend group.   Clinical Observations/Feedback:  Patient did not attend group.   Nissi Doffing LRT/CTRS        Quinnetta Roepke 12/02/2020 11:12 AM
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
## 2020-12-02 NOTE — Tx Team (Addendum)
Interdisciplinary Treatment and Diagnostic Plan Update  12/02/2020 Time of Session: 9:00AM Eldine Rencher MRN: [id number]  Principal Diagnosis: <principal problem not specified>  Secondary Diagnoses: Active Problems:   Severe major depression, single episode (HCC)   Current Medications:  Current Facility-Administered Medications  Medication Dose Route Frequency Provider Last Rate Last Admin   acetaminophen (TYLENOL) tablet 650 mg  650 mg Oral Q6H PRN Clapacs, Madie Reno, MD       alum & mag hydroxide-simeth (MAALOX/MYLANTA) 200-200-20 MG/5ML suspension 30 mL  30 mL Oral Q4H PRN Clapacs, John T, MD       gemfibrozil (LOPID) tablet 600 mg  600 mg Oral Daily Clapacs, John T, MD   600 mg at 12/02/20 9390   hydrOXYzine (ATARAX/VISTARIL) tablet 25 mg  25 mg Oral TID PRN Caroline Sauger, NP   25 mg at 12/01/20 2348   LORazepam (ATIVAN) injection 0-4 mg  0-4 mg Intravenous Q6H Clapacs, Madie Reno, MD       Or   LORazepam (ATIVAN) tablet 0-4 mg  0-4 mg Oral Q6H Clapacs, John T, MD   2 mg at 12/02/20 0312   [START ON 12/03/2020] LORazepam (ATIVAN) injection 0-4 mg  0-4 mg Intravenous Q12H Clapacs, Madie Reno, MD       Or   Derrill Memo ON 12/03/2020] LORazepam (ATIVAN) tablet 0-4 mg  0-4 mg Oral Q12H Clapacs, John T, MD       magnesium hydroxide (MILK OF MAGNESIA) suspension 30 mL  30 mL Oral Daily PRN Clapacs, John T, MD       metFORMIN (GLUCOPHAGE-XR) 24 hr tablet 500 mg  500 mg Oral Q breakfast Clapacs, John T, MD   500 mg at 12/02/20 0817   metoprolol tartrate (LOPRESSOR) tablet 50 mg  50 mg Oral BID Clapacs, John T, MD   50 mg at 12/02/20 3009   nicotine (NICODERM CQ - dosed in mg/24 hours) patch 21 mg  21 mg Transdermal Daily Clapacs, John T, MD   21 mg at 12/02/20 0818   thiamine tablet 100 mg  100 mg Oral Daily Clapacs, John T, MD   100 mg at 12/02/20 2330   Or   thiamine (B-1) injection 100 mg  100 mg Intravenous Daily Clapacs, Madie Reno, MD       PTA Medications: Medications Prior to Admission   Medication Sig Dispense Refill Last Dose   gemfibrozil (LOPID) 600 MG tablet Take 600 mg by mouth daily.      metFORMIN (GLUCOPHAGE) 500 MG tablet Take 500 mg by mouth daily.      nystatin cream (MYCOSTATIN) Apply 1 application topically 2 (two) times daily. 30 g 6    sertraline (ZOLOFT) 25 MG tablet Take 1 tablet (25 mg total) by mouth daily. 30 tablet 1     Patient Stressors: Marital or family conflict Substance abuse  Patient Strengths: Ability for insight Motivation for treatment/growth  Treatment Modalities: Medication Management, Group therapy, Case management,  1 to 1 session with clinician, Psychoeducation, Recreational therapy.   Physician Treatment Plan for Primary Diagnosis: <principal problem not specified> Long Term Goal(s):     Short Term Goals:    Medication Management: Evaluate patient's response, side effects, and tolerance of medication regimen.  Therapeutic Interventions: 1 to 1 sessions, Unit Group sessions and Medication administration.  Evaluation of Outcomes: Not Met  Physician Treatment Plan for Secondary Diagnosis: Active Problems:   Severe major depression, single episode ([place])  Long Term Goal(s):     Short Term Goals:  Medication Management: Evaluate patient's response, side effects, and tolerance of medication regimen.  Therapeutic Interventions: 1 to 1 sessions, Unit Group sessions and Medication administration.  Evaluation of Outcomes: Not Met   RN Treatment Plan for Primary Diagnosis: <principal problem not specified> Long Term Goal(s): Knowledge of disease and therapeutic regimen to maintain health will improve  Short Term Goals: Ability to demonstrate self-control, Ability to participate in decision making will improve, Ability to verbalize feelings will improve, Ability to disclose and discuss suicidal ideas, Ability to identify and develop effective coping behaviors will improve, and Compliance with prescribed medications will  improve  Medication Management: RN will administer medications as ordered by provider, will assess and evaluate patient's response and provide education to patient for prescribed medication. RN will report any adverse and/or side effects to prescribing provider.  Therapeutic Interventions: 1 on 1 counseling sessions, Psychoeducation, Medication administration, Evaluate responses to treatment, Monitor vital signs and CBGs as ordered, Perform/monitor CIWA, COWS, AIMS and Fall Risk screenings as ordered, Perform wound care treatments as ordered.  Evaluation of Outcomes: Not Met   LCSW Treatment Plan for Primary Diagnosis: <principal problem not specified> Long Term Goal(s): Safe transition to appropriate next level of care at discharge, Engage patient in therapeutic group addressing interpersonal concerns.  Short Term Goals: Engage patient in aftercare planning with referrals and resources, Increase social support, Increase ability to appropriately verbalize feelings, Increase emotional regulation, Facilitate acceptance of mental health diagnosis and concerns, and Increase skills for wellness and recovery  Therapeutic Interventions: Assess for all discharge needs, 1 to 1 time with Social worker, Explore available resources and support systems, Assess for adequacy in community support network, Educate family and significant other(s) on suicide prevention, Complete Psychosocial Assessment, Interpersonal group therapy.  Evaluation of Outcomes: Not Met   Progress in Treatment: Attending groups: No. Participating in groups: No. Taking medication as prescribed: Yes. Toleration medication: Yes. Family/Significant other contact made: No, will contact:  once permission is given. Patient understands diagnosis: No. Discussing patient identified problems/goals with staff: Yes. Medical problems stabilized or resolved: No. Denies suicidal/homicidal ideation: Yes. Issues/concerns per patient  self-inventory: No. Other: none  New problem(s) identified: No, Describe:  none  New Short Term/Long Term Goal(s): detox, elimination of symptoms of psychosis, medication management for mood stabilization; elimination of SI thoughts; development of comprehensive mental wellness/sobriety plan.   Patient Goals:  "I didn't really have goals.  I wanted to feel better and talk to someone."  Discharge Plan or Barriers: CSW will assist patient in developing appropriate discharge plans.  Reason for Continuation of Hospitalization: Anxiety Depression Medication stabilization Suicidal ideation  Estimated Length of Stay:  1-7 days Recreational Therapy: Patient Stressors: N/A Patient Goal: Patient will engage in groups without prompting or encouragement from LRT x3 group sessions within 5 recreation therapy group sessions.  Attendees: Patient: Floreine Kingdon  12/02/2020 9:36 AM  Physician: Dr. Domingo Cocking, MD  12/02/2020 9:36 AM  Nursing: West Pugh, RN  12/02/2020 9:36 AM  RN Care Manager: 12/02/2020 9:36 AM  Social Worker: Assunta Curtis, MSW, LCSW  12/02/2020 9:36 AM  Recreational Therapist: Roanna Epley, Reather Converse, LRT  12/02/2020 9:36 AM  Other: Kiva Martinique, MSW, LCSWA  12/02/2020 9:36 AM  Other: Gwynneth Munson" Wales, LCSW  12/02/2020 9:36 AM  Other: Paulla Dolly, MSW, [place], [place]  12/02/2020 9:36 AM  Other: Waldon Merl, NP 12/02/2020 9:36 AM    Scribe for Treatment Team: Rozann Lesches, LCSW 12/02/2020 9:36 AM

## 2020-12-02 NOTE — BHH Suicide Risk Assessment (Signed)
BHH INPATIENT:  Family/Significant Other Suicide Prevention Education  Suicide Prevention Education:  Contact Attempts: Brandy Cummings, sister, [phone] has been identified by the patient as the family member/significant other with whom the patient will be residing, and identified as the person(s) who will aid the patient in the event of a mental health crisis.  With written consent from the patient, two attempts were made to provide suicide prevention education, prior to and/or following the patient's discharge.  We were unsuccessful in providing suicide prevention education.  A suicide education pamphlet was given to the patient to share with family/significant other.  Date and time of first attempt:12/02/2020 12:34PM Date and time of second attempt:Second attempt is needed.    CSW left HIPPA compliant voicemail.   Brandy Cummings 12/02/2020, 12:33 PM

## 2020-12-02 NOTE — BHH Group Notes (Signed)
CSW met with the patient to discuss inpatient treatment to address substance use.   Patient declined inpatient at this time.  Patient did request information on local AA meetings.  CSW provided the patient with a list of local [place] meetings.  Assunta Curtis, MSW, LCSW 12/02/2020 2:03 PM

## 2020-12-02 NOTE — Tx Team (Signed)
Initial Treatment Plan 12/02/2020 12:17 AM [name] RNH:[id number]    PATIENT STRESSORS: Marital or family conflict Substance abuse   PATIENT STRENGTHS: Ability for insight Motivation for treatment/growth   PATIENT IDENTIFIED PROBLEMS: Depression  Anxiety                   DISCHARGE CRITERIA:  Improved stabilization in mood, thinking, and/or behavior Verbal commitment to aftercare and medication compliance  PRELIMINARY DISCHARGE PLAN: Outpatient therapy Return to previous living arrangement  PATIENT/FAMILY INVOLVEMENT: This treatment plan has been presented to and reviewed with the patient, [name]. The patient has been given the opportunity to ask questions and make suggestions.  Elmyra Ricks, RN 12/02/2020, 12:17 AM

## 2020-12-02 NOTE — Plan of Care (Signed)
Patient new to the unit tonight, hasn't had time to progress  Problem: Education: Goal: Knowledge of [place] [facility] Education information/materials will improve Outcome: Not Progressing Goal: Emotional status will improve Outcome: Not Progressing Goal: Mental status will improve Outcome: Not Progressing Goal: Verbalization of understanding the information provided will improve Outcome: Not Progressing   Problem: Safety: Goal: Periods of time without injury will increase Outcome: Not Progressing   Problem: Education: Goal: Utilization of techniques to improve thought processes will improve Outcome: Not Progressing Goal: Knowledge of the prescribed therapeutic regimen will improve Outcome: Not Progressing   Problem: Safety: Goal: Ability to disclose and discuss suicidal ideas will improve Outcome: Not Progressing Goal: Ability to identify and utilize support systems that promote safety will improve Outcome: Not Progressing   

## 2020-12-02 NOTE — Plan of Care (Signed)
Patient verbalized that she is not happy for being here because there is no TV or inter net. Patient stated that she is not interested in any inpatient treatment program. Patient states " I have never been depressed." Denies SI,HI and AVH. Minimal withdrawal symptoms verbalized.Diarrhea subsided with 3 x doses of Imodium. Appetite and energy level good. Appropriate with staff & peers. ADLs maintained. Support and encouragement given.
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
## 2020-12-02 NOTE — BHH Counselor (Signed)
CSW met with the patient and she identified her desired treatment provider. [address] [place], [phone].    CSW left HIPPA compliant voicemail.  Assunta Curtis, MSW, LCSW 12/02/2020 3:53 PM

## 2020-12-02 NOTE — BHH Suicide Risk Assessment (Signed)
[facility] Admission Suicide Risk Assessment   Nursing information obtained from:  Patient Demographic factors:  NA Current Mental Status:  NA Loss Factors:  NA Historical Factors:  NA Risk Reduction Factors:  NA  Total Time spent with patient: 1 hour Principal Problem: Severe major depression, single episode (HCC) Diagnosis:  Principal Problem:   Severe major depression, single episode (HCC) Active Problems:   Essential hypertension   Mixed hyperlipidemia   Alcohol abuse   Ileostomy in place [facility])  Subjective Data: 65 year old female presenting for worsening depression and suicidal ideations.  At this time patient denies any suicidal ideations or thoughts of being better off dead.  See H&P for details.  Continued Clinical Symptoms:  Alcohol Use Disorder Identification Test Final Score (AUDIT): 9 The "Alcohol Use Disorders Identification Test", Guidelines for Use in Primary Care, Second Edition.  World Science writer [facility]). Score between 0-7:  no or low risk or alcohol related problems. Score between 8-15:  moderate risk of alcohol related problems. Score between 16-19:  high risk of alcohol related problems. Score 20 or above:  warrants further diagnostic evaluation for alcohol dependence and treatment.   CLINICAL FACTORS:   Depression:   Comorbid alcohol abuse/dependence Insomnia Alcohol/Substance Abuse/Dependencies Medical Diagnoses and Treatments/Surgeries   Musculoskeletal: Strength & Muscle Tone: within normal limits Gait & Station: normal Patient leans: N/A  Psychiatric Specialty Exam:  Presentation  General Appearance: Appropriate for Environment; Casual  Eye Contact:Good  Speech:Normal Rate; Clear and Coherent  Speech Volume:Normal  Handedness:Right   Mood and Affect  Mood:Dysphoric  Affect:Congruent   Thought Process  Thought Processes:Coherent; Goal Directed  Descriptions of Associations:Intact  Orientation:Full (Time, Place and  Person)  Thought Content:Logical  History of Schizophrenia/Schizoaffective disorder:No  Duration of Psychotic Symptoms:No data recorded Hallucinations:Hallucinations: None  Ideas of Reference:None  Suicidal Thoughts:Suicidal Thoughts: No  Homicidal Thoughts:Homicidal Thoughts: No   Sensorium  Memory:Immediate Fair; Recent Fair; Remote Fair  Judgment:Intact  Insight:Present   Executive Functions  Concentration:Good  Attention Span:Good  Recall:Good  Fund of Knowledge:Good  Language:Good   Psychomotor Activity  Psychomotor Activity:Psychomotor Activity: Normal   Assets  Assets:Communication Skills; Desire for Improvement; Financial Resources/Insurance; Housing; Resilience; Social Support   Sleep  Sleep:Sleep: Fair Number of Hours of Sleep: 5    Physical Exam: Physical Exam ROS Blood pressure (!) 148/80, pulse 87, temperature 98.3 F (36.8 C), temperature source Oral, resp. rate 18, height 5\' 6"  (1.676 m), weight 80.3 kg, SpO2 94 %. Body mass index is 28.57 kg/m.   COGNITIVE FEATURES THAT CONTRIBUTE TO RISK:  Thought constriction (tunnel vision)    SUICIDE RISK:   Mild:  Suicidal ideation of limited frequency, intensity, duration, and specificity.  There are no identifiable plans, no associated intent, mild dysphoria and related symptoms, good self-control (both objective and subjective assessment), few other risk factors, and identifiable protective factors, including available and accessible social support.  PLAN OF CARE: Continue inpatient admission see H&P for details  I certify that inpatient services furnished can reasonably be expected to improve the patient's condition.   , MD 12/02/2020, 1:11 PM

## 2020-12-03 DIAGNOSIS — F322 Major depressive disorder, single episode, severe without psychotic features: Secondary | ICD-10-CM | POA: Diagnosis not present

## 2020-12-03 MED ORDER — GEMFIBROZIL 600 MG PO TABS: 600.0000 mg | ORAL_TABLET | Freq: Every day | ORAL | 1 refills | Status: AC

## 2020-12-03 MED ORDER — TRAZODONE HCL 50 MG PO TABS: 50.0000 mg | ORAL_TABLET | Freq: Every evening | ORAL | 1 refills | Status: DC | PRN

## 2020-12-03 MED ORDER — METOPROLOL TARTRATE 50 MG PO TABS: 50.0000 mg | ORAL_TABLET | Freq: Two times a day (BID) | ORAL | 1 refills | Status: DC

## 2020-12-03 NOTE — BHH Counselor (Signed)
CSW again attempted to call [facility], [facility], [phone].  CSW left HIPAA compliant voicemail requesting a return call.  Penni Homans, MSW, LCSW 12/03/2020 10:25 AM

## 2020-12-03 NOTE — BHH Suicide Risk Assessment (Signed)
[facility] Discharge Suicide Risk Assessment   Principal Problem: Severe major depression, single episode (HCC) Discharge Diagnoses: Principal Problem:   Severe major depression, single episode (HCC) Active Problems:   Essential hypertension   Mixed hyperlipidemia   Alcohol abuse   Ileostomy in place [facility])   Total Time spent with patient: 35 minutes- 25 minutes face-to-face contact with patient, 10 minutes documentation, coordination of care, scripts   Musculoskeletal: Strength & Muscle Tone: within normal limits Gait & Station: normal Patient leans: N/A  Psychiatric Specialty Exam  Presentation  General Appearance: Appropriate for Environment; Casual  Eye Contact:Good  Speech:Clear and Coherent; Normal Rate  Speech Volume:Normal  Handedness:Right   Mood and Affect  Mood:Irritable  Duration of Depression Symptoms: Greater than two weeks  Affect:Congruent   Thought Process  Thought Processes:Coherent; Goal Directed  Descriptions of Associations:Intact  Orientation:Full (Time, Place and Person)  Thought Content:Logical  History of Schizophrenia/Schizoaffective disorder:No  Duration of Psychotic Symptoms:No data recorded Hallucinations:Hallucinations: None  Ideas of Reference:None  Suicidal Thoughts:Suicidal Thoughts: No  Homicidal Thoughts:Homicidal Thoughts: No   Sensorium  Memory:Immediate Fair; Recent Fair; Remote Fair  Judgment:Intact  Insight:Present   Executive Functions  Concentration:Good  Attention Span:Good  Recall:Good  Fund of Knowledge:Good  Language:Good   Psychomotor Activity  Psychomotor Activity:Psychomotor Activity: Normal   Assets  Assets:Communication Skills; Desire for Improvement; Financial Resources/Insurance; Housing; Resilience; Social Support   Sleep  Sleep:Sleep: Fair Number of Hours of Sleep: 5   Physical Exam: Physical Exam ROS Blood pressure (!) 152/88, pulse 84, temperature 98.5 F (36.9 C),  temperature source Oral, resp. rate 17, height 5\' 6"  (1.676 m), weight 80.3 kg, SpO2 96 %. Body mass index is 28.57 kg/m.  Mental Status Per Nursing Assessment::   On Admission:  NA  Demographic Factors:  Caucasian and Living alone  Loss Factors: Decline in physical health  Historical Factors: NA  Risk Reduction Factors:   Sense of responsibility to family, Religious beliefs about death, Positive social support, Positive therapeutic relationship, and Positive coping skills or problem solving skills  Continued Clinical Symptoms:  Depression:   Comorbid alcohol abuse/dependence Medical Diagnoses and Treatments/Surgeries  Cognitive Features That Contribute To Risk:  None    Suicide Risk:  Mild:  Suicidal ideation of limited frequency, intensity, duration, and specificity.  There are no identifiable plans, no associated intent, mild dysphoria and related symptoms, good self-control (both objective and subjective assessment), few other risk factors, and identifiable protective factors, including available and accessible social support.    Plan Of Care/Follow-up recommendations:  Activity:  as tolerated Diet:  low sodium heart healthy diet  [phone], MD 12/03/2020, 9:54 AM

## 2020-12-03 NOTE — Progress Notes (Signed)
D: Pt alert and oriented. Pt denies experiencing any SI/HI, or AVH at this time. Pt reports she will be able to keep herself safe when she returns home.   A: Pt received discharge and medication education/information. Pt belongings were returned and signed for at this time.   R: Pt verbalized understanding of discharge and medication education/information.  Pt  escorted to physician's on call parking by staff where safe transport picked pt up.

## 2020-12-03 NOTE — Progress Notes (Signed)
  [facility] Adult Case Management Discharge Plan :  Will you be returning to the same living situation after discharge:  Yes,  pt's home At discharge, do you have transportation home?: Yes,  CSW will arrange safe transportation for pt Do you have the ability to pay for your medications: Yes,  BC[employer] insurance  Release of information consent forms completed and in the chart;  Patient's signature needed at discharge.  Patient to Follow up at:  Follow-up Information     [facility]. Schedule an appointment as soon as possible for a visit.   Why: Please contact in order to make an appointment. Contact information: [address] [place], [place] [address] Tel: [phone]  Fax: [phone] E-mail:  [email or web address]                Next level of care provider has access to [facility] Link:no  Safety Planning and Suicide Prevention discussed: Yes,  completed with pt's sister     Has patient been referred to the Quitline?: Patient refused referral  Patient has been referred for addiction treatment: Pt. refused referral  Alston Berrie A [place], LCSWA 12/03/2020, 10:55 AM

## 2020-12-03 NOTE — Progress Notes (Signed)
Recreation Therapy Notes  Date: 12/03/2020  Time: 10:15 am   Location: Courtyard   Behavioral response: N/A   Intervention Topic: Leisure    Discussion/Intervention: Patient did not attend group.   Clinical Observations/Feedback:  Patient did not attend group.   Yarnell Kozloski LRT/CTRS        Shamecca Whitebread 12/03/2020 12:04 PM

## 2020-12-03 NOTE — Progress Notes (Signed)
D: Pt alert and oriented. Pt rates depression 0/10, hopelessness 0/10, and anxiety 3/10. Pt goal: "Go home." Pt reports energy level as low and concentration as being good. Pt reports sleep last night as being fair. Pt did receive medications for sleep and did find them helpful. Pt reports experiencing 1/10 Right shoulder pain and buttocks pain, prn meds offered and refused by pt. Pt denies experiencing any SI/HI, or AVH at this time.   A: Scheduled medications administered to pt, per MD orders. Support and encouragement provided. Frequent verbal contact made. Routine safety checks conducted q15 minutes.   R: No adverse drug reactions noted. Pt verbally contracts for safety at this time. Pt complaint with medications and treatment plan. Pt interacts well with others on the unit. Pt remains safe at this time. Will continue to monitor.

## 2020-12-03 NOTE — Discharge Summary (Signed)
Physician Discharge Summary Note  Patient:  Brandy Cummings is an 65 y.o., female MRN:  [id number] DOB:  04-01-1956 Patient phone:  [phone] (home)  Patient address:   [address] [place] [address],  Total Time spent with patient: 35 minutes- 25 minutes face-to-face contact with patient, 10 minutes documentation, coordination of care, scripts   Date of Admission:  12/01/2020 Date of Discharge: 12/03/2020  Reason for Admission:  Worsening depression and suicidal ideations  Principal Problem: Severe major depression, single episode [facility]) Discharge Diagnoses: Principal Problem:   Severe major depression, single episode (HCC) Active Problems:   Essential hypertension   Mixed hyperlipidemia   Alcohol abuse   Ileostomy in place [facility])   Past Psychiatric History: No prior hospitalizations.  Has previously been prescribed Zoloft, Prozac, and Wellbutrin from her primary care doctor.  Has not seen a therapist or psychiatrist.  She is open to starting outpatient therapy at discharge.  No history of DTs or seizures from alcohol use.  Past Medical History:  Past Medical History:  Diagnosis Date   Diabetes mellitus without complication (HCC)    Hyperlipidemia    Hypertension     Past Surgical History:  Procedure Laterality Date   ANKLE FRACTURE SURGERY Left    COLONOSCOPY  10/24/2010   cleared for 5 years- [place]    Family History:  Family History  Problem Relation Age of Onset   Diabetes Mother    Cancer Father    Heart disease Father    Cancer Maternal Grandfather    Cancer Paternal Grandmother    Family Psychiatric  History: Denies Social History:  Social History   Substance and Sexual Activity  Alcohol Use No   Alcohol/week: 0.0 standard drinks     Social History   Substance and Sexual Activity  Drug Use No    Social History   Socioeconomic History   Marital status: Divorced    Spouse name: Not on file   Number of children: Not on file   Years of  education: Not on file   Highest education level: Not on file  Occupational History   Not on file  Tobacco Use   Smoking status: Every Day    Packs/day: 1.00    Years: 40.00    Pack years: 40.00    Types: Cigarettes   Smokeless tobacco: Never  Substance and Sexual Activity   Alcohol use: No    Alcohol/week: 0.0 standard drinks   Drug use: No   Sexual activity: Never  Other Topics Concern   Not on file  Social History Narrative   Not on file   Social Determinants of Health   Financial Resource Strain: Not on file  Food Insecurity: Not on file  Transportation Needs: Not on file  Physical Activity: Not on file  Stress: Not on file  Social Connections: Not on file    Hospital Course:  Patient admitted for worsening depression and suicidal ideations in the context of numerous life stressors. We discussed RBA of mirtazapine vs. SSRI for treatment of depression, but patient declined. She would rather try therapy for treatment of depression. Outpatient resources were provided to patient along with lists of AA meetings. She completed acute alcohol detox without complication. She was continued on her home medications for HTN and HLD. She denies SI/HI/AH/VH, and is able to contract for safety at home. She does not own any guns.   Physical Findings: AIMS:  , ,  ,  ,    CIWA:  CIWA-Ar  Total: 3 COWS:     Musculoskeletal: Strength & Muscle Tone: within normal limits Gait & Station: normal Patient leans: N/A   Psychiatric Specialty Exam:  Presentation  General Appearance: Appropriate for Environment; Casual  Eye Contact:Good  Speech:Clear and Coherent; Normal Rate  Speech Volume:Normal  Handedness:Right   Mood and Affect  Mood:Irritable  Affect:Congruent   Thought Process  Thought Processes:Coherent; Goal Directed  Descriptions of Associations:Intact  Orientation:Full (Time, Place and Person)  Thought Content:Logical  History of Schizophrenia/Schizoaffective  disorder:No  Duration of Psychotic Symptoms:No data recorded Hallucinations:Hallucinations: None  Ideas of Reference:None  Suicidal Thoughts:Suicidal Thoughts: No  Homicidal Thoughts:Homicidal Thoughts: No   Sensorium  Memory:Immediate Fair; Recent Fair; Remote Fair  Judgment:Intact  Insight:Present   Executive Functions  Concentration:Good  Attention Span:Good  Recall:Good  Fund of Knowledge:Good  Language:Good   Psychomotor Activity  Psychomotor Activity:Psychomotor Activity: Normal   Assets  Assets:Communication Skills; Desire for Improvement; Financial Resources/Insurance; Housing; Resilience; Social Support   Sleep  Sleep:Sleep: Fair Number of Hours of Sleep: 5    Physical Exam: Physical Exam Vitals and nursing note reviewed.  Constitutional:      Appearance: Normal appearance.  HENT:     Head: Normocephalic and atraumatic.     Right Ear: External ear normal.     Left Ear: External ear normal.     Nose: Nose normal.     Mouth/Throat:     Mouth: Mucous membranes are moist.     Pharynx: Oropharynx is clear.  Eyes:     Extraocular Movements: Extraocular movements intact.     Conjunctiva/sclera: Conjunctivae normal.     Pupils: Pupils are equal, round, and reactive to light.  Cardiovascular:     Rate and Rhythm: Normal rate.     Pulses: Normal pulses.  Pulmonary:     Effort: Pulmonary effort is normal.     Breath sounds: Normal breath sounds.  Abdominal:     General: Abdomen is flat.     Palpations: Abdomen is soft.  Musculoskeletal:        General: No swelling. Normal range of motion.     Cervical back: Normal range of motion and neck supple.  Skin:    General: Skin is warm and dry.  Neurological:     General: No focal deficit present.     Mental Status: She is alert and oriented to person, place, and time.  Psychiatric:        Mood and Affect: Mood normal.        Behavior: Behavior normal.        Thought Content: Thought content  normal.        Judgment: Judgment normal.   Review of Systems  Constitutional: Negative.   HENT: Negative.    Eyes: Negative.   Respiratory: Negative.    Cardiovascular: Negative.   Gastrointestinal:  Positive for diarrhea. Negative for nausea.  Genitourinary: Negative.   Musculoskeletal: Negative.   Skin: Negative.   Neurological: Negative.   Endo/Heme/Allergies: Negative.   Psychiatric/Behavioral:  Negative for hallucinations, memory loss and suicidal ideas.   Blood pressure (!) 152/88, pulse 84, temperature 98.5 F (36.9 C), temperature source Oral, resp. rate 17, height 5\' 6"  (1.676 m), weight 80.3 kg, SpO2 96 %. Body mass index is 28.57 kg/m.   Social History   Tobacco Use  Smoking Status Every Day   Packs/day: 1.00   Years: 40.00   Pack years: 40.00   Types: Cigarettes  Smokeless Tobacco Never   Tobacco Cessation:  A  prescription for an FDA-approved tobacco cessation medication was offered at discharge and the patient refused   Blood Alcohol level:  Lab Results  Component Value Date   ETH 314 [facility]) 12/01/2020    Metabolic Disorder Labs:  Lab Results  Component Value Date   HGBA1C 5.3 12/02/2020   MPG 105.41 12/02/2020   No results found for: PROLACTIN Lab Results  Component Value Date   CHOL 258 (H) 12/02/2020   TRIG 87 12/02/2020   HDL 126 12/02/2020   CHOLHDL 2.0 12/02/2020   VLDL 17 12/02/2020   LDLCALC 115 (H) 12/02/2020   LDLCALC 108 (H) 10/21/2020    See Psychiatric Specialty Exam and Suicide Risk Assessment completed by Attending Physician prior to discharge.  Discharge destination:  Home  Is patient on multiple antipsychotic therapies at discharge:  No   Has Patient had three or more failed trials of antipsychotic monotherapy by history:  No  Recommended Plan for Multiple Antipsychotic Therapies: NA  Discharge Instructions     Diet - low sodium heart healthy   Complete by: As directed    Increase activity slowly   Complete by: As  directed       Allergies as of 12/03/2020   No Known Allergies      Medication List     STOP taking these medications    metFORMIN 500 MG tablet Commonly known as: GLUCOPHAGE   nystatin cream Commonly known as: MYCOSTATIN   sertraline 25 MG tablet Commonly known as: ZOLOFT       TAKE these medications      Indication  gemfibrozil 600 MG tablet Commonly known as: LOPID Take 1 tablet (600 mg total) by mouth daily. Start taking on: December 04, 2020  Indication: High Amount of Triglycerides in the Blood   metoprolol tartrate 50 MG tablet Commonly known as: LOPRESSOR Take 1 tablet (50 mg total) by mouth 2 (two) times daily.  Indication: High Blood Pressure Disorder   traZODone 50 MG tablet Commonly known as: DESYREL Take 1 tablet (50 mg total) by mouth at bedtime as needed for sleep.  Indication: Trouble Sleeping         Follow-up recommendations:  Activity:  as tolerated Diet:  low sodium heart healthy diet  Comments:  Printed 30-day scripts provided to patient at discharge along with outpatient therapy resources  Signed: Jesse Sans, MD 12/03/2020, 9:55 AM

## 2020-12-03 NOTE — Progress Notes (Signed)
Patient pleasant and cooperative. Reports she would like to go home. States what she is doing she can do at home. Denies SI, HI, AVH. Isolative to self and room. Comes out for meals and meds.  Encouragement and support provided. Safety checks maintained, medications given as prescribed. Pt receptive and remains safe on unit with q 15 min checks.

## 2021-02-08 ENCOUNTER — Encounter: Payer: Self-pay | Admitting: General Surgery

## 2021-02-19 ENCOUNTER — Encounter: Payer: Self-pay | Admitting: Family Medicine

## 2021-02-19 ENCOUNTER — Ambulatory Visit: Payer: BC Managed Care – PPO | Admitting: Family Medicine

## 2021-02-19 ENCOUNTER — Other Ambulatory Visit: Payer: Self-pay

## 2021-02-19 VITALS — BP 130/82 | HR 80 | Ht 66.0 in | Wt 189.0 lb

## 2021-02-19 DIAGNOSIS — I10 Essential (primary) hypertension: Secondary | ICD-10-CM | POA: Diagnosis not present

## 2021-02-19 DIAGNOSIS — Z23 Encounter for immunization: Secondary | ICD-10-CM | POA: Diagnosis not present

## 2021-02-19 MED ORDER — METOPROLOL TARTRATE 50 MG PO TABS: 50.0000 mg | ORAL_TABLET | Freq: Two times a day (BID) | ORAL | 1 refills | Status: AC

## 2021-02-19 NOTE — Progress Notes (Signed)
Date:  02/19/2021   Name:  Brandy Cummings   DOB:  [birth year]   MRN:  [id number]   Chief Complaint: Hypertension  Hypertension This is a chronic problem. The current episode started more than 1 year ago. The problem has been gradually improving since onset. The problem is controlled. Associated symptoms include blurred vision and malaise/fatigue. Pertinent negatives include no anxiety, chest pain, headaches, neck pain, orthopnea, palpitations, peripheral edema, PND, shortness of breath or sweats. There are no associated agents to hypertension. Past treatments include beta blockers. The current treatment provides moderate improvement. There are no compliance problems.  There is no history of angina, kidney disease, CAD/MI, CVA, heart failure, left ventricular hypertrophy, PVD or retinopathy. There is no history of chronic renal disease, a hypertension causing med or renovascular disease.   Lab Results  Component Value Date   CREATININE 0.62 12/01/2020   BUN <5 (L) 12/01/2020   NA 134 (L) 12/01/2020   K 3.5 12/01/2020   CL 95 (L) 12/01/2020   CO2 23 12/01/2020   Lab Results  Component Value Date   CHOL 258 (H) 12/02/2020   HDL 126 12/02/2020   LDLCALC 115 (H) 12/02/2020   TRIG 87 12/02/2020   CHOLHDL 2.0 12/02/2020   No results found for: TSH Lab Results  Component Value Date   HGBA1C 5.3 12/02/2020   Lab Results  Component Value Date   WBC 4.8 12/01/2020   HGB 16.1 (H) 12/01/2020   HCT 44.2 12/01/2020   MCV 106.5 (H) 12/01/2020   PLT 114 (L) 12/01/2020   Lab Results  Component Value Date   ALT 45 (H) 12/01/2020   AST 138 (H) 12/01/2020   ALKPHOS 115 12/01/2020   BILITOT 1.9 (H) 12/01/2020     Review of Systems  Constitutional:  Positive for malaise/fatigue. Negative for chills and fever.  HENT:  Positive for sore throat. Negative for drooling, ear discharge, ear pain and facial swelling.   Eyes:  Positive for blurred vision.  Respiratory:  Negative for cough,  shortness of breath and wheezing.   Cardiovascular:  Negative for chest pain, palpitations, orthopnea, leg swelling and PND.  Gastrointestinal:  Positive for diarrhea and nausea. Negative for abdominal pain, blood in stool and constipation.  Endocrine: Negative for polydipsia.  Genitourinary:  Negative for dysuria, frequency, hematuria and urgency.  Musculoskeletal:  Negative for back pain, myalgias and neck pain.  Skin:  Negative for rash.  Allergic/Immunologic: Negative for environmental allergies.  Neurological:  Negative for dizziness and headaches.  Hematological:  Positive for adenopathy. Does not bruise/bleed easily.  Psychiatric/Behavioral:  Negative for suicidal ideas. The patient is not nervous/anxious.    Patient Active Problem List   Diagnosis Date Noted   Severe recurrent major depression without psychotic features (HCC) 12/01/2020   Alcohol abuse 12/01/2020   Severe major depression, single episode (HCC) 12/01/2020   Cigarette nicotine dependence without complication 10/21/2020   Ileostomy in place [facility]) 06/01/2020   Class 2 severe obesity due to excess calories with serious comorbidity and body mass index (BMI) of 37.0 to 37.9 in adult [facility]) 07/02/2019   Recurrent major depressive disorder, in partial remission (HCC) 08/02/2016   Moderate episode of recurrent major depressive disorder (HCC) 08/02/2016   Essential hypertension 01/26/2015   Mixed hyperlipidemia 01/26/2015   Type 2 diabetes mellitus without complication, without long-term current use of insulin (HCC) 01/26/2015    No Known Allergies  Past Surgical History:  Procedure Laterality Date   ANKLE FRACTURE SURGERY Left  COLONOSCOPY  10/24/2010   cleared for 5 years- [employer]     Social History   Tobacco Use   Smoking status: Every Day    Packs/day: 1.00    Years: 40.00    Pack years: 40.00    Types: Cigarettes   Smokeless tobacco: Never  Substance Use Topics   Alcohol use: No    Alcohol/week: 0.0  standard drinks   Drug use: No     Medication list has been reviewed and updated.  Current Meds  Medication Sig   diphenoxylate-atropine (LOMOTIL) 2.5-0.025 MG tablet Take by mouth 4 (four) times daily as needed for diarrhea or loose stools. GI   metoprolol tartrate (LOPRESSOR) 50 MG tablet Take 1 tablet (50 mg total) by mouth 2 (two) times daily.    PHQ 2/9 Scores 02/19/2021 10/21/2020 06/25/2020 01/01/2020  PHQ - 2 Score - 5 4 2   PHQ- 9 Score - 10 9 5   Exception Documentation Patient refusal - - -    GAD 7 : Generalized Anxiety Score 10/21/2020 06/25/2020 07/02/2019  Nervous, Anxious, on Edge 2 1 0  Control/stop worrying 2 2 0  Worry too much - different things 2 1 0  Trouble relaxing 0 0 0  Restless 0 1 0  Easily annoyed or irritable 2 3 1   Afraid - awful might happen 1 1 0  Total GAD 7 Score 9 9 1   Anxiety Difficulty Somewhat difficult Somewhat difficult Not difficult at all    BP Readings from Last 3 Encounters:  02/19/21 130/82  12/01/20 (!) 150/82  10/21/20 (!) 158/82    Physical Exam Vitals and nursing note reviewed.  Constitutional:      Appearance: She is well-developed.  HENT:     Head: Normocephalic.     Right Ear: Tympanic membrane, ear canal and external ear normal.     Left Ear: Tympanic membrane, ear canal and external ear normal.     Nose: Nose normal.  Eyes:     General: Lids are everted, no foreign bodies appreciated. No scleral icterus.       Left eye: No foreign body or hordeolum.     Conjunctiva/sclera: Conjunctivae normal.     Right eye: Right conjunctiva is not injected.     Left eye: Left conjunctiva is not injected.     Pupils: Pupils are equal, round, and reactive to light.  Neck:     Thyroid: No thyromegaly.     Vascular: No JVD.     Trachea: No tracheal deviation.  Cardiovascular:     Rate and Rhythm: Normal rate and regular rhythm.     Heart sounds: Normal heart sounds. No murmur heard.   No friction rub. No gallop.  Pulmonary:      Effort: Pulmonary effort is normal. No respiratory distress.     Breath sounds: Normal breath sounds. No wheezing, rhonchi or rales.  Abdominal:     General: Bowel sounds are normal.     Palpations: Abdomen is soft. There is no mass.     Tenderness: There is no abdominal tenderness. There is no guarding or rebound.  Musculoskeletal:        General: No tenderness. Normal range of motion.     Cervical back: Normal range of motion and neck supple.  Lymphadenopathy:     Cervical: No cervical adenopathy.  Skin:    General: Skin is warm.     Findings: No rash.  Neurological:     Mental Status: She is alert.  Cranial Nerves: No cranial nerve deficit.     Deep Tendon Reflexes: Reflexes normal.  Psychiatric:        Mood and Affect: Mood is not anxious or depressed.    Wt Readings from Last 3 Encounters:  02/19/21 189 lb (85.7 kg)  12/01/20 177 lb (80.3 kg)  10/21/20 178 lb (80.7 kg)    BP 130/82   Pulse 80   Ht 5\' 6"  (1.676 m)   Wt 189 lb (85.7 kg)   BMI 30.51 kg/m   Assessment and Plan:  1. Need for immunization against influenza Discussed and administered - Flu Vaccine QUAD 35mo+IM (Fluarix, Fluzone & Alfiuria Quad PF)  2. Primary hypertension Chronic.  Controlled.  Stable.  Continue metoprolol 50 mg 1 tablet twice a day.  We will recheck in 6 months - metoprolol tartrate (LOPRESSOR) 50 MG tablet; Take 1 tablet (50 mg total) by mouth 2 (two) times daily.  Dispense: 180 tablet; Refill: 1

## 2021-02-20 ENCOUNTER — Encounter: Payer: Self-pay | Admitting: Family Medicine

## 2021-02-22 ENCOUNTER — Other Ambulatory Visit: Payer: Self-pay

## 2021-02-22 DIAGNOSIS — B370 Candidal stomatitis: Secondary | ICD-10-CM

## 2021-02-22 MED ORDER — FLUCONAZOLE 150 MG PO TABS: 150.0000 mg | ORAL_TABLET | Freq: Once | ORAL | 0 refills | Status: AC

## 2021-02-22 MED ORDER — NYSTATIN 100000 UNIT/ML MT SUSP: 5.0000 mL | Freq: Three times a day (TID) | OROMUCOSAL | 0 refills | Status: DC

## 2021-02-22 NOTE — Progress Notes (Unsigned)
Sent in diflucan and mycostatin

## 2021-02-26 ENCOUNTER — Other Ambulatory Visit: Payer: Self-pay

## 2021-02-26 DIAGNOSIS — B370 Candidal stomatitis: Secondary | ICD-10-CM

## 2021-02-26 MED ORDER — FLUCONAZOLE 150 MG PO TABS: 150.0000 mg | ORAL_TABLET | Freq: Once | ORAL | 0 refills | Status: AC

## 2021-02-26 MED ORDER — NYSTATIN 100000 UNIT/ML MT SUSP: 5.0000 mL | Freq: Three times a day (TID) | OROMUCOSAL | 0 refills | Status: AC

## 2021-03-14 ENCOUNTER — Encounter: Payer: Self-pay | Admitting: Family Medicine

## 2021-03-15 ENCOUNTER — Ambulatory Visit: Payer: BC Managed Care – PPO | Admitting: Family Medicine

## 2021-03-15 ENCOUNTER — Other Ambulatory Visit: Payer: Self-pay

## 2021-03-15 ENCOUNTER — Encounter: Payer: Self-pay | Admitting: Family Medicine

## 2021-03-15 ENCOUNTER — Telehealth: Payer: Self-pay

## 2021-03-15 VITALS — BP 152/78 | HR 67 | Resp 17 | Ht 66.0 in | Wt 190.4 lb

## 2021-03-15 DIAGNOSIS — R300 Dysuria: Secondary | ICD-10-CM

## 2021-03-15 DIAGNOSIS — N342 Other urethritis: Secondary | ICD-10-CM | POA: Diagnosis not present

## 2021-03-15 LAB — POCT URINALYSIS DIPSTICK
Bilirubin, UA: NEGATIVE
Glucose, UA: NEGATIVE
Ketones, UA: NEGATIVE
Nitrite, UA: NEGATIVE
Protein, UA: NEGATIVE
Spec Grav, UA: 1.01 (ref 1.010–1.025)
Urobilinogen, UA: 0.2 E.U./dL
pH, UA: 5 (ref 5.0–8.0)

## 2021-03-15 MED ORDER — CEPHALEXIN 500 MG PO CAPS: 500.0000 mg | ORAL_CAPSULE | Freq: Two times a day (BID) | ORAL | 0 refills | Status: AC

## 2021-03-15 MED ORDER — FLUCONAZOLE 150 MG PO TABS: 150.0000 mg | ORAL_TABLET | Freq: Once | ORAL | 0 refills | Status: AC

## 2021-03-15 MED ORDER — CEPHALEXIN 500 MG PO CAPS: 500.0000 mg | ORAL_CAPSULE | Freq: Two times a day (BID) | ORAL | 0 refills | Status: DC

## 2021-03-15 NOTE — Telephone Encounter (Signed)
Supposed to be 10 days. Resent Rx.

## 2021-03-15 NOTE — Progress Notes (Signed)
Date:  03/15/2021   Name:  Brandy Cummings   DOB:  12-14-1955   MRN:  [id number]   Chief Complaint: Urinary Tract Infection (Dysuria, urinary frequency, urgency x 1 mth )  Urinary Tract Infection  This is a chronic problem. The current episode started 1 to 4 weeks ago. The problem occurs intermittently. The problem has been waxing and waning. The quality of the pain is described as burning. The pain is at a severity of 2/10. There has been no fever. Associated symptoms include frequency and urgency. Pertinent negatives include no chills, discharge, flank pain, hematuria, hesitancy, nausea, sweats or vomiting.   Lab Results  Component Value Date   CREATININE 0.62 12/01/2020   BUN <5 (L) 12/01/2020   NA 134 (L) 12/01/2020   K 3.5 12/01/2020   CL 95 (L) 12/01/2020   CO2 23 12/01/2020   Lab Results  Component Value Date   CHOL 258 (H) 12/02/2020   HDL 126 12/02/2020   LDLCALC 115 (H) 12/02/2020   TRIG 87 12/02/2020   CHOLHDL 2.0 12/02/2020   No results found for: TSH Lab Results  Component Value Date   HGBA1C 5.3 12/02/2020   Lab Results  Component Value Date   WBC 4.8 12/01/2020   HGB 16.1 (H) 12/01/2020   HCT 44.2 12/01/2020   MCV 106.5 (H) 12/01/2020   PLT 114 (L) 12/01/2020   Lab Results  Component Value Date   ALT 45 (H) 12/01/2020   AST 138 (H) 12/01/2020   ALKPHOS 115 12/01/2020   BILITOT 1.9 (H) 12/01/2020   No components found for: VITD  Review of Systems  Constitutional:  Negative for chills and fever.  HENT:  Negative for drooling, ear discharge, ear pain and sore throat.   Respiratory:  Negative for cough, shortness of breath and wheezing.   Cardiovascular:  Negative for chest pain, palpitations and leg swelling.  Gastrointestinal:  Negative for abdominal pain, blood in stool, constipation, diarrhea, nausea and vomiting.  Endocrine: Negative for polydipsia.  Genitourinary:  Positive for frequency and urgency. Negative for dysuria, flank pain, hematuria  and hesitancy.  Musculoskeletal:  Negative for back pain, myalgias and neck pain.  Skin:  Negative for rash.  Allergic/Immunologic: Negative for environmental allergies.  Neurological:  Negative for dizziness and headaches.  Hematological:  Does not bruise/bleed easily.  Psychiatric/Behavioral:  Negative for suicidal ideas. The patient is not nervous/anxious.    Patient Active Problem List   Diagnosis Date Noted   Severe recurrent major depression without psychotic features ([place]) 12/01/2020   Alcohol abuse 12/01/2020   Severe major depression, single episode ([place]) 12/01/2020   Cigarette nicotine dependence without complication [id number]   Ileostomy in place [facility]) 06/01/2020   Class 2 severe obesity due to excess calories with serious comorbidity and body mass index (BMI) of 37.0 to 37.9 in adult [facility]) 07/02/2019   Recurrent major depressive disorder, in partial remission ([place]) 08/02/2016   Moderate episode of recurrent major depressive disorder ([place]) 08/02/2016   Essential hypertension 01/26/2015   Mixed hyperlipidemia 01/26/2015   Type 2 diabetes mellitus without complication, without long-term current use of insulin ([place]) 01/26/2015    No Known Allergies  Past Surgical History:  Procedure Laterality Date   ANKLE FRACTURE SURGERY Left    COLONOSCOPY  10/24/2010   cleared for 5 years- [place]     Social History   Tobacco Use   Smoking status: Every Day    Packs/day: 1.00    Years: 40.00  Pack years: 40.00    Types: Cigarettes   Smokeless tobacco: Never  Substance Use Topics   Alcohol use: No    Alcohol/week: 0.0 standard drinks   Drug use: No     Medication list has been reviewed and updated.  Current Meds  Medication Sig   diphenoxylate-atropine (LOMOTIL) 2.5-0.025 MG tablet Take by mouth 4 (four) times daily as needed for diarrhea or loose stools. GI   metoprolol tartrate (LOPRESSOR) 50 MG tablet Take 1 tablet (50 mg total) by mouth 2 (two) times daily.     PHQ 2/9 Scores 02/19/2021 10/21/2020 06/25/2020 01/01/2020  PHQ - 2 Score - 5 4 2   PHQ- 9 Score - 10 9 5   Exception Documentation Patient refusal - - -    GAD 7 : Generalized Anxiety Score 10/21/2020 06/25/2020 07/02/2019  Nervous, Anxious, on Edge 2 1 0  Control/stop worrying 2 2 0  Worry too much - different things 2 1 0  Trouble relaxing 0 0 0  Restless 0 1 0  Easily annoyed or irritable 2 3 1   Afraid - awful might happen 1 1 0  Total GAD 7 Score 9 9 1   Anxiety Difficulty Somewhat difficult Somewhat difficult Not difficult at all    BP Readings from Last 3 Encounters:  03/15/21 (!) 190/92  02/19/21 130/82  12/01/20 (!) 150/82    Physical Exam Vitals and nursing note reviewed.  Constitutional:      General: She is not in acute distress.    Appearance: She is not diaphoretic.  HENT:     Head: Normocephalic and atraumatic.     Right Ear: Tympanic membrane and external ear normal.     Left Ear: Tympanic membrane and external ear normal.     Nose: Nose normal. No congestion or rhinorrhea.     Mouth/Throat:     Mouth: Mucous membranes are moist.  Eyes:     General:        Right eye: No discharge.        Left eye: No discharge.     Conjunctiva/sclera: Conjunctivae normal.     Pupils: Pupils are equal, round, and reactive to light.  Neck:     Thyroid: No thyromegaly.     Vascular: No JVD.  Cardiovascular:     Rate and Rhythm: Normal rate and regular rhythm.     Heart sounds: Normal heart sounds. No murmur heard.   No friction rub. No gallop.  Pulmonary:     Effort: Pulmonary effort is normal.     Breath sounds: Normal breath sounds. No wheezing, rhonchi or rales.  Abdominal:     General: Bowel sounds are normal.     Palpations: Abdomen is soft. There is no mass.     Tenderness: There is no abdominal tenderness. There is no guarding or rebound.  Musculoskeletal:        General: Normal range of motion.     Cervical back: Normal range of motion and neck supple.   Lymphadenopathy:     Cervical: No cervical adenopathy.  Skin:    General: Skin is warm and dry.     Capillary Refill: Capillary refill takes less than 2 seconds.  Neurological:     Mental Status: She is alert.     Deep Tendon Reflexes: Reflexes are normal and symmetric.    Wt Readings from Last 3 Encounters:  03/15/21 190 lb 6.4 oz (86.4 kg)  02/19/21 189 lb (85.7 kg)  12/01/20 177 lb (80.3 kg)  BP (!) 190/92 (BP Location: Right Arm, Patient Position: Sitting, Cuff Size: Normal)   Pulse 67   Resp 17   Ht 5\' 6"  (1.676 m)   Wt 190 lb 6.4 oz (86.4 kg)   SpO2 97%   BMI 30.73 kg/m   Assessment and Plan:

## 2021-03-15 NOTE — Telephone Encounter (Signed)
Copied from CRM [id number]. Topic: Quick Communication - Rx Refill/Question >> Mar 15, 2021  4:10 PM Pawlus, Maxine Glenn A wrote: Pharmacist calling in requesting a new script be sent over for cephALEXin (KEFLEX) 500 MG capsule.  "Dispense Quantity: 6 , Sig: Take 1 capsule (500 mg total) by mouth 2 (two) times daily for 10 days" Pharmacist wanted to know if it was meant to state dispense quantity of 20 on the Rx, please advise if this was supposed to be a 3 day supply or 10 day supply due to confusion on quantity written.

## 2021-05-21 ENCOUNTER — Encounter: Payer: Self-pay | Admitting: Family Medicine

## 2021-05-21 ENCOUNTER — Other Ambulatory Visit: Payer: Self-pay

## 2021-05-21 DIAGNOSIS — B370 Candidal stomatitis: Secondary | ICD-10-CM

## 2021-05-21 MED ORDER — FLUCONAZOLE 150 MG PO TABS: 150.0000 mg | ORAL_TABLET | Freq: Once | ORAL | 0 refills | Status: AC

## 2021-07-29 ENCOUNTER — Other Ambulatory Visit: Payer: Self-pay

## 2021-08-17 ENCOUNTER — Encounter: Payer: Self-pay | Admitting: Family Medicine

## 2021-08-20 ENCOUNTER — Ambulatory Visit: Payer: BC Managed Care – PPO | Admitting: Family Medicine

## 2021-09-29 ENCOUNTER — Ambulatory Visit: Payer: Self-pay

## 2021-09-29 NOTE — Telephone Encounter (Signed)
  Chief Complaint: Leg and foot swelling to groin, heart "twitches", diarrhea for 20 months, recent falls, fatigue Symptoms: Ibid Frequency: Swelling started 3 days ago Pertinent Negatives: Patient denies SOB Disposition: [x] ED /[] Urgent Care (no appt availability in office) / [] Appointment(In office/virtual)/ []  [facility]/ [] Home Care/ [] Refused Recommended Disposition /[] [facility]/ []  Follow-up with PCP Additional Notes: Pt's sister called for her sister. I was able to call PT and have a 3 way conversation with pt and sister. Pt has several complaints. At least 2 of these are quite worrisome. Pt has bilateral leg swelling to the groin starting 3 days ago. She has also had some "chest twitches". Pt states she has had diarrhea for 20 months, at least 2 falls (EMS was called to assist her back up), and fatigue.  Pt also has skin issues, knee pain, mental health issues and urinary incontinence. PT will use her"button" to call EMS so that she can be transported to an ED that is close to her sister. (IF she calls EMS they will take her to closest facility, which she does not want to go to.) Recommended ED. Reason for Disposition  SEVERE leg swelling (e.g., swelling extends above knee, entire leg is swollen, weeping fluid)  Answer Assessment - Initial Assessment Questions 1. ONSET: "When did the swelling start?" (e.g., minutes, hours, days)     3 days ago 2. LOCATION: "What part of the leg is swollen?"  "Are both legs swollen or just one leg?"     Both legs to groin 3. SEVERITY: "How bad is the swelling?" (e.g., localized; mild, moderate, severe)  - Localized - small area of swelling localized to one leg  - MILD pedal edema - swelling limited to foot and ankle, pitting edema < 1/4 inch (6 mm) deep, rest and elevation eliminate most or all swelling  - MODERATE edema - swelling of lower leg to knee, pitting edema > 1/4 inch (6 mm) deep, rest and elevation only partially  reduce swelling  - SEVERE edema - swelling extends above knee, facial or hand swelling present      Severe legs swollen to groin 4. REDNESS: "Does the swelling look red or infected?"     Small spot on top of foot 5. PAIN: "Is the swelling painful to touch?" If Yes, ask: "How painful is it?"   (Scale 1-10; mild, moderate or severe)     Yes - mild 6. FEVER: "Do you have a fever?" If Yes, ask: "What is it, how was it measured, and when did it start?"      fever 7. CAUSE: "What do you think is causing the leg swelling?"     Unsure 8. MEDICAL HISTORY: "Do you have a history of heart failure, kidney disease, liver failure, or cancer?"     Renal failure,  9. RECURRENT SYMPTOM: "Have you had leg swelling before?" If Yes, ask: "When was the last time?" "What happened that time?"      10. OTHER SYMPTOMS: "Do you have any other symptoms?" (e.g., chest pain, difficulty breathing)       Chest pain -  11. PREGNANCY: "Is there any chance you are pregnant?" "When was your last menstrual period?"       na  Protocols used: Leg Swelling and Edema-A-AH

## 2021-10-19 ENCOUNTER — Telehealth: Payer: Self-pay

## 2021-10-19 NOTE — Telephone Encounter (Signed)
I was asked to return call from Clydie Braun, pt's sister by fellow [place], as I had spoken with both pt and sister a few weeks back for a triage.   I returned Karen's call.  Clydie Braun states that she is very worried about her sister.She states that her sister is an end stage alcoholic, who spends her time drinking and sleeping. Clydie Braun states that her sister needs help. Per Clydie Braun the sister will not leave her home. Pt has gone to rehab a few times. Per Clydie Braun the pt ,"cannot imagine a day where she cannot drink and chain smoke all day".    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  Clydie Braun does not know how to help her sister. Clydie Braun just hired a helper for her sister for 12 hours a week, but feels that this is not enough.   Clydie Braun has a POA for Pt.   Please advise how to best help. She is unsure whether this is end of life and needs to help with transition or whether her sister can be helped to regain her life.

## 2021-10-22 ENCOUNTER — Telehealth: Payer: Self-pay | Admitting: Family Medicine

## 2021-10-22 ENCOUNTER — Telehealth: Payer: Self-pay

## 2021-10-22 NOTE — Telephone Encounter (Signed)
[facility] called saying the patients family has requested that Dr. Yetta Barre will serve as attending physician for the patients hospice.  Hospice said they can order the meds but they need to know if she will be the attending.  They need something saying pt needs to be in hospice care ([facility])  CB#  [phone]

## 2021-10-22 NOTE — Telephone Encounter (Signed)
Chakyra from [facility] called to see if JOnes would be the attending physician. I returned call and spoke to receptionist. I explained to them that Yetta Barre would be out of town and would prefer pt have facility doctor be the attending.

## 2021-10-25 ENCOUNTER — Telehealth: Payer: Self-pay | Admitting: Family Medicine

## 2021-10-25 NOTE — Telephone Encounter (Signed)
PC to pt sister Clydie Braun, she wanted to know what we could do because pt was refusing to go to hospital for work up per Hospice, informed patient we aren't able to force pt. She voiced understanding. Explained Tara and Dr. Yetta Barre are out of the office for the week, she voiced understanding.

## 2021-10-25 NOTE — Telephone Encounter (Signed)
Copied from CRM [id number]. Topic: General - Other >> Oct 25, 2021 11:34 AM Everette C wrote: Reason for CRM: The patient's sister has requested to speak with Delice Bison when possible to continue an ongoing conversation from last week  Please contact the patient's sister further when possible
